# Patient Record
Sex: Female | Born: 2000 | Race: Black or African American | Hispanic: No | Marital: Single | Smoking: Never smoker
Health system: Southern US, Community
[De-identification: ages and names within clinical notes are randomized; demographics above are authoritative.]

## PROBLEM LIST (undated history)

## (undated) HISTORY — PX: ARTHROSCOPIC REPAIR ACL: SUR80

---
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
## 2019-01-01 ENCOUNTER — Ambulatory Visit (INDEPENDENT_AMBULATORY_CARE_PROVIDER_SITE_OTHER): Payer: 59 | Admitting: Physical Therapy

## 2019-01-01 ENCOUNTER — Encounter: Payer: Self-pay | Admitting: Physical Therapy

## 2019-01-01 ENCOUNTER — Other Ambulatory Visit: Payer: Self-pay

## 2019-01-01 DIAGNOSIS — M6281 Muscle weakness (generalized): Secondary | ICD-10-CM | POA: Diagnosis not present

## 2019-01-01 DIAGNOSIS — R2689 Other abnormalities of gait and mobility: Secondary | ICD-10-CM | POA: Diagnosis not present

## 2019-01-01 DIAGNOSIS — M25561 Pain in right knee: Secondary | ICD-10-CM | POA: Diagnosis not present

## 2019-01-01 NOTE — Therapy (Signed)
[place], [place], [address] Phone: [phone]   Fax:  [phone]  Physical Therapy Treatment  Patient Details  Name: Ashley Fischer MRN: [id number] Date of Birth: [birth year] Referring Provider (PT): Dr Alphonzo Grieve    Encounter Date: 01/01/2019  PT End of Session - 01/01/19 0905    Visit Number  4    Number of Visits  12    Date for PT Re-Evaluation  01/15/19    PT Start Time  0901    PT Stop Time  0952    PT Time Calculation (min)  51 min       History reviewed. No pertinent past medical history.  Past Surgical History:  Procedure Laterality Date  . ARTHROSCOPIC REPAIR ACL      There were no vitals filed for this visit.  Subjective Assessment - 01/01/19 0906    Subjective  Pt reports she worked out on eliptical 2x for 10 min since last visit.  She still has difficulty with single leg mini squats. She believes her knee feels less puffy.     Patient Stated Goals  return to play basketball     Currently in Pain?  No/denies         [facility] PT Assessment - 01/01/19 0001      Assessment   Medical Diagnosis  Rt knee scope     Referring Provider (PT)  Dr Alphonzo Grieve     Onset Date/Surgical Date  11/09/18   surgery - injury ~ 10/26/2018   Hand Dominance  Right    Next MD Visit  to be scheduled.       Strength   Right Hip Flexion  5/5    Right Hip Extension  5/5    Right Hip ABduction  --   5-/5   Right Hip ADduction  5/5    Right/Left Knee  Right    Right Knee Flexion  --   5-/5   Right Knee Extension  5/5       [facility] Adult PT Treatment/Exercise - 01/01/19 0001      Knee/Hip Exercises: Stretches   Passive Hamstring Stretch  Right;Left;2 reps;30 seconds    Quad Stretch  Right;Left;2 reps;30 seconds    Gastroc Stretch  Both;2 reps;30 seconds      Knee/Hip Exercises: Aerobic   Elliptical  L3: 6 min       Knee/Hip Exercises: Machines for Technical brewer with black/ blue band x 2.5 min - with cues for sequencing       Knee/Hip Exercises: Plyometrics   Bilateral Jumping  10 reps;Box Height: 2";Box Height: 4";2 sets   VC on soft landing and alignment   Bilateral Jumping Limitations  (forward jumping, then Rt retro step up) - cues for weight shift to R )      Knee/Hip Exercises: Standing   SLS  Rt SLS on upside Bosu x 1 min with head turns (occasional UE to steady); R/L single leg deadlift to touch cone on floor x 10    Other Standing Knee Exercises  resisted walking (XTS- pink) x 10 reps lateral stepping R/L, backward x 5 reps     Other Standing Knee Exercises  light skipping x 25 ft x 6;  side shuffle at slow to slow-med speed (improved form) x 8 reps of 25 ft.       Knee/Hip Exercises: Seated   Stool Scoot - Round Trips  80 ft  on tile; 12 ft x 4 reps on carpet      Knee/Hip Exercises: Sidelying   Other Sidelying Knee/Hip Exercises  side plank x 15 sec x 2 reps each side.          PT Short Term Goals - 12/25/18 0947      PT SHORT TERM GOAL #1   Title  Initial rehab program addressing ROM and active movement per protocol 12/22/2018    Time  4    Period  Weeks    Status  Achieved      PT SHORT TERM GOAL #2   Title  ROM Rt knee 0 degrees extension to 115 degrees flexion 12/22/2018    Time  4    Period  Weeks    Status  Achieved      PT SHORT TERM GOAL #3   Title  Improve gait pattern with patient to demonstrated equal stride length and weight bearing bilat LE's 12/22/2018    Time  4    Period  Weeks    Status  On-going   improved     PT SHORT TERM GOAL #4   Title  I in initial HEP 12/22/2018    Time  4    Period  Weeks    Status  Achieved      PT SHORT TERM GOAL #5   Title  patient to demonstrated step over step ascending and descending stairs x 4 steps 12/22/2018    Time  4    Period  Weeks    Status  Achieved        PT Long Term Goals - 01/01/19 0958      PT LONG TERM GOAL #1   Title  I with  advanced HEP 01/15/2019    Time  6    Period  Weeks    Status  On-going      PT LONG TERM GOAL #2   Title  increase Rt LE strength to 5/5 01/15/2019    Time  6    Period  Weeks    Status  Partially Met      PT LONG TERM GOAL #3   Title  Patient reports ability to ambulate for community distances including stairs 01/15/2019    Time  6    Period  Weeks    Status  Achieved   per pt report     PT LONG TERM GOAL #4   Title  tolerate high level plyometrics without knee pain and good mechanics 01/15/2019    Time  6    Period  Weeks    Status  On-going      PT LONG TERM GOAL #5   Title  improve FOTO =/< 28% limitation 01/15/2019    Time  6    Period  Weeks    Status  On-going            Plan - 01/01/19 0939    Clinical Impression Statement  Pt demonstrated improved Rt hip and knee strength; some weakness in Rt hip abdct and Lt hamstring. She has partially met LTG#2 and achieved LTG #3.  She tolerated all exercises well with mild discomfort reported with light plyometrics.  Pt declined modalities; will do at home.      Rehab Potential  Excellent    PT Frequency  2x / week    PT Duration  6 weeks    PT Treatment/Interventions  Patient/family education;ADLs/Self Care Home Management;Cryotherapy;Electrical Stimulation;Iontophoresis 61m/ml Dexamethasone;Moist Heat;Ultrasound;Dry needling;Manual  techniques;Neuromuscular re-education;Balance training;Gait training;Therapeutic activities;Therapeutic exercise    PT Next Visit Plan  continue progressive stretching/strengthening working into sports specific exercises.  modalities as indicated.  begin jog progression. continue agility and plyometrics.    Consulted and Agree with Plan of Care  Patient       Patient will benefit from skilled therapeutic intervention in order to improve the following deficits and impairments:  Abnormal gait, Decreased activity tolerance, Decreased endurance, Decreased range of motion, Decreased mobility,  Decreased strength, Pain, Increased fascial restricitons  Visit Diagnosis: Acute pain of right knee  Muscle weakness (generalized)  Other abnormalities of gait and mobility     Problem List Patient Active Problem List   Diagnosis Date Noted  . Left anterior cruciate ligament tear 09/08/2015  . ALLERGIC RHINITIS, SEASONAL 01/01/2011  . EPISTAXIS, RECURRENT 01/01/2011   Kerin Perna, PTA 01/01/19 10:10 AM   [place], [place], [address] Phone: [phone]   Fax:  [phone]  Name: Ashley Fischer MRN: [id number] Date of Birth: 2000/11/21

## 2019-01-04 ENCOUNTER — Encounter: Payer: Self-pay | Admitting: Physical Therapy

## 2019-01-04 ENCOUNTER — Ambulatory Visit (INDEPENDENT_AMBULATORY_CARE_PROVIDER_SITE_OTHER): Payer: 59 | Admitting: Physical Therapy

## 2019-01-04 ENCOUNTER — Other Ambulatory Visit: Payer: Self-pay

## 2019-01-04 DIAGNOSIS — M25561 Pain in right knee: Secondary | ICD-10-CM

## 2019-01-04 DIAGNOSIS — M6281 Muscle weakness (generalized): Secondary | ICD-10-CM

## 2019-01-04 DIAGNOSIS — R2689 Other abnormalities of gait and mobility: Secondary | ICD-10-CM

## 2019-01-04 NOTE — Therapy (Signed)
[place], [place], [address] Phone: [phone]   Fax:  [phone]  Physical Therapy Treatment  Patient Details  Name: Ashley Fischer MRN: [id number] Date of Birth: December 12, 2000 Referring Provider (PT): Dr Alphonzo Grieve    Encounter Date: 01/04/2019  PT End of Session - 01/04/19 0903    Visit Number  5    Number of Visits  12    Date for PT Re-Evaluation  01/15/19    PT Start Time  0903    PT Stop Time  0953    PT Time Calculation (min)  50 min    Activity Tolerance  Patient tolerated treatment well    Behavior During Therapy  [facility] for tasks assessed/performed       History reviewed. No pertinent past medical history.  Past Surgical History:  Procedure Laterality Date  . ARTHROSCOPIC REPAIR ACL      There were no vitals filed for this visit.  Subjective Assessment - 01/04/19 0904    Subjective  Pt reports her single leg exercises are getting some easier, leg is not as shaky.  she reports she continues to have pain in Rt knee with knee flexion in WB up to 5/10 (present since surgery).     Patient Stated Goals  return to play basketball     Currently in Pain?  No/denies         [facility] PT Assessment - 01/04/19 0001      Assessment   Medical Diagnosis  Rt knee scope     Referring Provider (PT)  Dr Alphonzo Grieve     Onset Date/Surgical Date  11/09/18   surgery - injury ~ 10/26/2018   Hand Dominance  Right    Next MD Visit  to be scheduled.        [facility] PT Treatment/Exercise - 01/04/19 0001      Knee/Hip Exercises: Stretches   Passive Hamstring Stretch  Both;1 rep;30 seconds   standing, forward bend   Quad Stretch  Left;Right;2 reps;30 seconds   standing   Gastroc Stretch  Both;2 reps;30 seconds      Knee/Hip Exercises: Aerobic   Elliptical  L3.5: 5 min     Tread Mill  3.5 mph walk x 1 mi, light jog @ 5.0 mph x 30 sec, walk @ 4.0 mph x 30 sec, jog @ 5.5 mph x  30 sec x 2 reps - total of 4 min       Knee/Hip Exercises: Standing   Lunge Walking - Round Trips  90 ft, limited depth (knee not touching floor    SLS  Rt/Lt SLS on upside Bosu with 5 reps of overhead press with 6#, then ball toss.  R/L single leg deadlift to touch cone on floor x 10; single leg hops up 4" step x 15, 6" step x 14 with UE support and cues for technique    Other Standing Knee Exercises  ladder agility drills: foward fast feet, scissors moving laterally, ski skip, lateral hops- all 4 reps of each. simulated shooting baskets (hopping 2 feet) x 30 ft x 4 reps; simulated lay ups (R/L) x 25 ft x 4 reps; skipping with emphasis on height of jump and soft landing x 25 ft x 8 reps     Other Standing Knee Exercises  side shuffle at med-fast speed x 30 ft x 6;  squat with wood chop holding 6# ball while standing on upside down Bosu  PT Short Term Goals - 12/25/18 0947      PT SHORT TERM GOAL #1   Title  Initial rehab program addressing ROM and active movement per protocol 12/22/2018    Time  4    Period  Weeks    Status  Achieved      PT SHORT TERM GOAL #2   Title  ROM Rt knee 0 degrees extension to 115 degrees flexion 12/22/2018    Time  4    Period  Weeks    Status  Achieved      PT SHORT TERM GOAL #3   Title  Improve gait pattern with patient to demonstrated equal stride length and weight bearing bilat LE's 12/22/2018    Time  4    Period  Weeks    Status  On-going   improved     PT SHORT TERM GOAL #4   Title  I in initial HEP 12/22/2018    Time  4    Period  Weeks    Status  Achieved      PT SHORT TERM GOAL #5   Title  patient to demonstrated step over step ascending and descending stairs x 4 steps 12/22/2018    Time  4    Period  Weeks    Status  Achieved        PT Long Term Goals - 01/01/19 0958      PT LONG TERM GOAL #1   Title  I with advanced HEP 01/15/2019    Time  6    Period  Weeks    Status  On-going      PT LONG TERM GOAL #2   Title   increase Rt LE strength to 5/5 01/15/2019    Time  6    Period  Weeks    Status  Partially Met      PT LONG TERM GOAL #3   Title  Patient reports ability to ambulate for community distances including stairs 01/15/2019    Time  6    Period  Weeks    Status  Achieved   per pt report     PT LONG TERM GOAL #4   Title  tolerate high level plyometrics without knee pain and good mechanics 01/15/2019    Time  6    Period  Weeks    Status  On-going      PT LONG TERM GOAL #5   Title  improve FOTO =/< 28% limitation 01/15/2019    Time  6    Period  Weeks    Status  On-going            Plan - 01/04/19 0953    Clinical Impression Statement  Pt tolerated all exercises with min increase in pain; reduced with standing rest breaks.  Noticable difference in take off / landing of single leg jumps; demonstrated decreased jump height/power on RLE.    Pt tolerated light walk/jog progression without noticable gait deviation or pain.  Pt is progressing well towards remaining goals.     Rehab Potential  Excellent    PT Frequency  2x / week    PT Duration  6 weeks    PT Treatment/Interventions  Patient/family education;ADLs/Self Care Home Management;Cryotherapy;Electrical Stimulation;Iontophoresis 46m/ml Dexamethasone;Moist Heat;Ultrasound;Dry needling;Manual techniques;Neuromuscular re-education;Balance training;Gait training;Therapeutic activities;Therapeutic exercise    PT Next Visit Plan  continue progressive stretching/strengthening working into sports specific exercises.  modalities as indicated. continue jog progression. continue agility and plyometrics.    Consulted  and Agree with Plan of Care  Patient       Patient will benefit from skilled therapeutic intervention in order to improve the following deficits and impairments:  Abnormal gait, Decreased activity tolerance, Decreased endurance, Decreased range of motion, Decreased mobility, Decreased strength, Pain, Increased fascial  restricitons  Visit Diagnosis: Acute pain of right knee  Muscle weakness (generalized)  Other abnormalities of gait and mobility     Problem List Patient Active Problem List   Diagnosis Date Noted  . Left anterior cruciate ligament tear 09/08/2015  . ALLERGIC RHINITIS, SEASONAL 01/01/2011  . EPISTAXIS, RECURRENT 01/01/2011   Kerin Perna, PTA 01/04/19 10:02 AM  [place], [place], [address] Phone: [phone]   Fax:  [phone]  Name: Ashley Fischer MRN: [id number] Date of Birth: 02-Dec-2000

## 2019-01-08 ENCOUNTER — Other Ambulatory Visit: Payer: Self-pay

## 2019-01-08 ENCOUNTER — Encounter: Payer: Self-pay | Admitting: Physical Therapy

## 2019-01-08 ENCOUNTER — Ambulatory Visit (INDEPENDENT_AMBULATORY_CARE_PROVIDER_SITE_OTHER): Payer: 59 | Admitting: Physical Therapy

## 2019-01-08 DIAGNOSIS — M6281 Muscle weakness (generalized): Secondary | ICD-10-CM

## 2019-01-08 DIAGNOSIS — M25561 Pain in right knee: Secondary | ICD-10-CM

## 2019-01-08 DIAGNOSIS — R2689 Other abnormalities of gait and mobility: Secondary | ICD-10-CM

## 2019-01-08 NOTE — Therapy (Signed)
[place], [place], [address] Phone: [phone]   Fax:  [phone]  Physical Therapy Treatment  Patient Details  Name: Ashley Fischer MRN: [id number] Date of Birth: Apr 20, 2001 Referring Provider (PT): Dr Alphonzo Grieve    Encounter Date: 01/08/2019  PT End of Session - 01/08/19 0907    Visit Number  6    Number of Visits  12    Date for PT Re-Evaluation  01/15/19    PT Start Time  0901    PT Stop Time  0950    PT Time Calculation (min)  49 min    Activity Tolerance  Patient tolerated treatment well;No increased pain    Behavior During Therapy  [facility] for tasks assessed/performed       History reviewed. No pertinent past medical history.  Past Surgical History:  Procedure Laterality Date  . ARTHROSCOPIC REPAIR ACL      There were no vitals filed for this visit.  Subjective Assessment - 01/08/19 0908    Subjective  Pt reports she was able to get up to 20 min on eliptical 2x this weekend, that was hard.  She still feels weakness in RLE with attempts to hop.     Pertinent History  Lt ACL repair 11/14/15 with extensive rehab following surgery    Patient Stated Goals  return to play basketball     Currently in Pain?  No/denies    Pain Score  0-No pain   just "stiffness"         [facility] PT Assessment - 01/08/19 0001      Assessment   Medical Diagnosis  Rt knee scope     Referring Provider (PT)  Dr Alphonzo Grieve     Onset Date/Surgical Date  11/09/18   surgery - injury ~ 10/26/2018   Hand Dominance  Right    Next MD Visit  to be scheduled.        [facility] Adult PT Treatment/Exercise - 01/08/19 0001      Knee/Hip Exercises: Stretches   Passive Hamstring Stretch  1 rep;30 seconds;Left;Right   standing, forward bend   Sports administrator  Left;Right;2 reps;30 seconds   standing   Gastroc Stretch  Both;2 reps;30 seconds      Knee/Hip Exercises: Aerobic   Elliptical  L3.5: 5 min     Tread Mill  3.5 mph walk x 1 mi, light jog @ 5.0 mph x 30 sec, walk @ 4.0 mph x 30 sec, jog @ 5.5 mph x 30 sec x 2 reps - total of 4 min       Knee/Hip Exercises: Machines for Strengthening   Total Gym Leg Press  RLE only, 6 plates x 10, 7 plates x 10 reps       Knee/Hip Exercises: Plyometrics   Bilateral Jumping  10 reps;2 sets;Box Height: 6";Box Height: 8"   VC on soft landing and alignment   Bilateral Jumping Limitations  (forward jumping, then Rt retro step up) - cues for weight shift to R )      Knee/Hip Exercises: Standing   Heel Raises  Right;Left;1 set;10 reps   with bilat shoulder ext with green band   Side Lunges  2 sets;Right;Left;10 reps   with 10# to touch ground   Side Lunges Limitations  challenging on RLE    Other Standing Knee Exercises  ladder agility drills: scissors moving laterally, ski skip x  5 reps of each. simulated shooting baskets (hopping 2 feet)  x 30 ft x 4 reps; simulated lay ups (R/L) x 25 ft x 4 reps;   circuit with light forward jogging, R/L side shuffle x 25 ft x 5 reps     Other Standing Knee Exercises  side shuffle at med-fast speed x 30 ft x 6;  squat with wood chop holding 11# ball while standing on upside down Bosu               PT Short Term Goals - 12/25/18 0947      PT SHORT TERM GOAL #1   Title  Initial rehab program addressing ROM and active movement per protocol 12/22/2018    Time  4    Period  Weeks    Status  Achieved      PT SHORT TERM GOAL #2   Title  ROM Rt knee 0 degrees extension to 115 degrees flexion 12/22/2018    Time  4    Period  Weeks    Status  Achieved      PT SHORT TERM GOAL #3   Title  Improve gait pattern with patient to demonstrated equal stride length and weight bearing bilat LE's 12/22/2018    Time  4    Period  Weeks    Status  On-going   improved     PT SHORT TERM GOAL #4   Title  I in initial HEP 12/22/2018    Time  4    Period  Weeks    Status  Achieved      PT SHORT TERM GOAL #5   Title  patient  to demonstrated step over step ascending and descending stairs x 4 steps 12/22/2018    Time  4    Period  Weeks    Status  Achieved        PT Long Term Goals - 01/01/19 0958      PT LONG TERM GOAL #1   Title  I with advanced HEP 01/15/2019    Time  6    Period  Weeks    Status  On-going      PT LONG TERM GOAL #2   Title  increase Rt LE strength to 5/5 01/15/2019    Time  6    Period  Weeks    Status  Partially Met      PT LONG TERM GOAL #3   Title  Patient reports ability to ambulate for community distances including stairs 01/15/2019    Time  6    Period  Weeks    Status  Achieved   per pt report     PT LONG TERM GOAL #4   Title  tolerate high level plyometrics without knee pain and good mechanics 01/15/2019    Time  6    Period  Weeks    Status  On-going      PT LONG TERM GOAL #5   Title  improve FOTO =/< 28% limitation 01/15/2019    Time  6    Period  Weeks    Status  On-going            Plan - 01/08/19 0951    Clinical Impression Statement  Pt tolerated all exercises well without any report of pain. She was able to demonstrate improved ability to maintain more even weight between feet from from 6-8" step (plyometric jumps) She continues to demonstrate functional weakness in RLE, with decreased jump height and hard landing with single leg hop and increased difficulty with  Rt single leg press. Pt is making gradual progress towards remaining goals.     Rehab Potential  Excellent    PT Frequency  2x / week    PT Duration  6 weeks    PT Treatment/Interventions  Patient/family education;ADLs/Self Care Home Management;Cryotherapy;Electrical Stimulation;Iontophoresis 58m/ml Dexamethasone;Moist Heat;Ultrasound;Dry needling;Manual techniques;Neuromuscular re-education;Balance training;Gait training;Therapeutic activities;Therapeutic exercise    PT Next Visit Plan  continue progressive stretching/strengthening working into sports specific exercises.  modalities as indicated.  continue jog progression. continue agility and plyometrics.    Consulted and Agree with Plan of Care  Patient       Patient will benefit from skilled therapeutic intervention in order to improve the following deficits and impairments:  Abnormal gait, Decreased activity tolerance, Decreased endurance, Decreased range of motion, Decreased mobility, Decreased strength, Pain, Increased fascial restricitons  Visit Diagnosis: Acute pain of right knee  Muscle weakness (generalized)  Other abnormalities of gait and mobility     Problem List Patient Active Problem List   Diagnosis Date Noted  . Left anterior cruciate ligament tear 09/08/2015  . ALLERGIC RHINITIS, SEASONAL 01/01/2011  . EPISTAXIS, RECURRENT 01/01/2011   JKerin Perna PTA 01/08/19 10:13 AM  [place] [address]: [phone]  Fax:  [phone] Name: Ashley FRANZONIMRN: [id number] of Birth: 1December 13, 2002

## 2019-01-11 ENCOUNTER — Encounter: Payer: Self-pay | Admitting: Physical Therapy

## 2019-01-11 ENCOUNTER — Ambulatory Visit (INDEPENDENT_AMBULATORY_CARE_PROVIDER_SITE_OTHER): Payer: 59 | Admitting: Physical Therapy

## 2019-01-11 ENCOUNTER — Other Ambulatory Visit: Payer: Self-pay

## 2019-01-11 DIAGNOSIS — M6281 Muscle weakness (generalized): Secondary | ICD-10-CM

## 2019-01-11 DIAGNOSIS — M25561 Pain in right knee: Secondary | ICD-10-CM

## 2019-01-11 DIAGNOSIS — R2689 Other abnormalities of gait and mobility: Secondary | ICD-10-CM

## 2019-01-11 NOTE — Therapy (Signed)
[place], [place], [address] Phone: [phone]   Fax:  [phone]  Physical Therapy Treatment  Patient Details  Name: Ashley Fischer MRN: [id number] Date of Birth: 07-Aug-2001 Referring Provider (PT): Dr Alphonzo Grieve    Encounter Date: 01/11/2019  PT End of Session - 01/11/19 0910    Visit Number  7    Number of Visits  12    Date for PT Re-Evaluation  01/15/19    PT Start Time  0900    PT Stop Time  0959    PT Time Calculation (min)  59 min    Activity Tolerance  Patient tolerated treatment well    Behavior During Therapy  [facility] for tasks assessed/performed       History reviewed. No pertinent past medical history.  Past Surgical History:  Procedure Laterality Date  . ARTHROSCOPIC REPAIR ACL      There were no vitals filed for this visit.  Subjective Assessment - 01/11/19 0910    Subjective  Pt reports her Rt knee remains stiff in morning and throughout the day if she is sitting too long.  she has some mild pain in Rt knee with HEP.     Patient Stated Goals  return to play basketball     Currently in Pain?  No/denies    Pain Score  0-No pain         [facility] PT Assessment - 01/11/19 0001      Assessment   Medical Diagnosis  Rt knee scope     Referring Provider (PT)  Dr Alphonzo Grieve     Onset Date/Surgical Date  11/09/18   surgery - injury ~ 10/26/2018   Hand Dominance  Right    Next MD Visit  to be scheduled.        [facility] Adult PT Treatment/Exercise - 01/11/19 0001      Knee/Hip Exercises: Stretches   Passive Hamstring Stretch  Both;2 reps;20 seconds    Quad Stretch  Left;Right;2 reps;30 seconds   standing   ITB Stretch  Right;Left;1 rep;20 seconds    Gastroc Stretch  Both;2 reps;30 seconds      Knee/Hip Exercises: Aerobic   Elliptical  L3.5: 3 min       Knee/Hip Exercises: Machines for Strengthening   Total Gym Leg Press  RLE x 10 with 7 plates x 2 sets      Knee/Hip Exercises: Plyometrics   Bilateral Jumping  2 sets   forward) 25-30 ft x 4 reps    Unilateral Jumping  2 sets   (forward) 25-30 ft x 4 reps   Other Plyometric Exercises  double leg jump hops x 25 ft x 4 (VC for equal landing); double leg jumps x 20 on mini tramp (mirror for visual feedback) VC to shft more weight to Rt; mini tuck jumps on mini tramp x 10;  alternating hop and stick x 25 ft x 4; power skipping x 25 ft x 4    Other Plyometric Exercises  wall jumps x 20 (10 slow, 10 fast), 2 sets;  single leg hop with light support on counter x 10, then forward/backward pattern x 20 - with focus on soft landing       Knee/Hip Exercises: Sidelying   Other Sidelying Knee/Hip Exercises  attempt at side crunches while on swiss ball; unable to complete due to difficulty maintaining starting position.       Knee/Hip Exercises: Prone   Other Prone Exercises  reverse hyperextension x 10 reps; superman on bosu x 10        PT Short Term Goals - 12/25/18 0947      PT SHORT TERM GOAL #1   Title  Initial rehab program addressing ROM and active movement per protocol 12/22/2018    Time  4    Period  Weeks    Status  Achieved      PT SHORT TERM GOAL #2   Title  ROM Rt knee 0 degrees extension to 115 degrees flexion 12/22/2018    Time  4    Period  Weeks    Status  Achieved      PT SHORT TERM GOAL #3   Title  Improve gait pattern with patient to demonstrated equal stride length and weight bearing bilat LE's 12/22/2018    Time  4    Period  Weeks    Status  On-going   improved     PT SHORT TERM GOAL #4   Title  I in initial HEP 12/22/2018    Time  4    Period  Weeks    Status  Achieved      PT SHORT TERM GOAL #5   Title  patient to demonstrated step over step ascending and descending stairs x 4 steps 12/22/2018    Time  4    Period  Weeks    Status  Achieved        PT Long Term Goals - 01/01/19 0958      PT LONG TERM GOAL #1   Title  I with advanced HEP 01/15/2019    Time  6     Period  Weeks    Status  On-going      PT LONG TERM GOAL #2   Title  increase Rt LE strength to 5/5 01/15/2019    Time  6    Period  Weeks    Status  Partially Met      PT LONG TERM GOAL #3   Title  Patient reports ability to ambulate for community distances including stairs 01/15/2019    Time  6    Period  Weeks    Status  Achieved   per pt report     PT LONG TERM GOAL #4   Title  tolerate high level plyometrics without knee pain and good mechanics 01/15/2019    Time  6    Period  Weeks    Status  On-going      PT LONG TERM GOAL #5   Title  improve FOTO =/< 28% limitation 01/15/2019    Time  6    Period  Weeks    Status  On-going            Plan - 01/11/19 1135    Clinical Impression Statement  Continued functional weakness demonstrated in RLE, with single leg exercises and plyometrics.  Pt tolerated all exercises well, with minimal increase in pain in knee.  She conitnues to have difficulty controlling landing on RLE and has minimal lift off with single leg jumps.   Adjusted HEP to include light plyometrics to assist with return to sport.  Gradually progressing towards remaining goals.     Rehab Potential  Excellent    PT Frequency  2x / week    PT Duration  6 weeks    PT Treatment/Interventions  Patient/family education;ADLs/Self Care Home Management;Cryotherapy;Electrical Stimulation;Iontophoresis 63m/ml Dexamethasone;Moist Heat;Ultrasound;Dry needling;Manual techniques;Neuromuscular re-education;Balance training;Gait training;Therapeutic activities;Therapeutic exercise    PT  Next Visit Plan  assess goals and need for additional visits; end of POC. assess response to new HEP.     Consulted and Agree with Plan of Care  Patient       Patient will benefit from skilled therapeutic intervention in order to improve the following deficits and impairments:  Abnormal gait, Decreased activity tolerance, Decreased endurance, Decreased range of motion, Decreased mobility,  Decreased strength, Pain, Increased fascial restricitons  Visit Diagnosis: Acute pain of right knee  Muscle weakness (generalized)  Other abnormalities of gait and mobility     Problem List Patient Active Problem List   Diagnosis Date Noted  . Left anterior cruciate ligament tear 09/08/2015  . ALLERGIC RHINITIS, SEASONAL 01/01/2011  . EPISTAXIS, RECURRENT 01/01/2011   Kerin Perna, PTA 01/11/19 11:50 AM  [facility] [place], [place], [address] Phone: [phone]   Fax:  [phone]  Name: KYNLEA BLACKSTON MRN: [id number] Date of Birth: [birth year]

## 2019-01-11 NOTE — Patient Instructions (Signed)
Wall jumps Single leg jumps at counter (soft landing) supermans Reverse hyperextension Side planks Single leg RDL Walking lunges Single leg bridge Single leg squat   3 way resisted leg lift while standing on foam  30 sec jog, 1 min walk run progression - 10 min total to start.

## 2019-01-15 ENCOUNTER — Ambulatory Visit (INDEPENDENT_AMBULATORY_CARE_PROVIDER_SITE_OTHER): Payer: 59 | Admitting: Physical Therapy

## 2019-01-15 ENCOUNTER — Encounter: Payer: Self-pay | Admitting: Physical Therapy

## 2019-01-15 ENCOUNTER — Other Ambulatory Visit: Payer: Self-pay

## 2019-01-15 DIAGNOSIS — M6281 Muscle weakness (generalized): Secondary | ICD-10-CM | POA: Diagnosis not present

## 2019-01-15 DIAGNOSIS — M25561 Pain in right knee: Secondary | ICD-10-CM | POA: Diagnosis not present

## 2019-01-15 DIAGNOSIS — R2689 Other abnormalities of gait and mobility: Secondary | ICD-10-CM | POA: Diagnosis not present

## 2019-01-15 NOTE — Therapy (Signed)
[place], [place], [address] Phone: [phone]   Fax:  [phone]  Physical Therapy Treatment  Patient Details  Name: Ashley Fischer MRN: [id number] Date of Birth: Dec 27, 2000 Referring Provider (PT): Dr Alphonzo Grieve    Encounter Date: 01/15/2019  PT End of Session - 01/15/19 0906    Visit Number  8    Number of Visits  22    Date for PT Re-Evaluation  02/26/19    PT Start Time  0902    PT Stop Time  0955    PT Time Calculation (min)  53 min    Activity Tolerance  Patient tolerated treatment well    Behavior During Therapy  [facility] for tasks assessed/performed       History reviewed. No pertinent past medical history.  Past Surgical History:  Procedure Laterality Date  . ARTHROSCOPIC REPAIR ACL      There were no vitals filed for this visit.  Subjective Assessment - 01/15/19 0907    Subjective  Pt reports she had some pain and buckling in Rt knee when she was on her eliptical.  Pain was up to 5/10; she iced it to reduce pain.      Patient Stated Goals  return to play basketball     Currently in Pain?  No/denies    Pain Score  0-No pain         [facility] PT Assessment - 01/15/19 0001      Assessment   Medical Diagnosis  Rt knee scope     Referring Provider (PT)  Dr Alphonzo Grieve     Onset Date/Surgical Date  11/09/18   surgery - injury ~ 10/26/2018   Hand Dominance  Right    Next MD Visit  to be scheduled.       Observation/Other Assessments   Focus on Therapeutic Outcomes (FOTO)   36% limitation (28% goal)      Strength   Right/Left Hip  Right    Right Hip Flexion  5/5    Right Hip Extension  5/5    Right Hip ABduction  5/5    Right Hip ADduction  5/5    Right/Left Knee  Right;Left    Right Knee Flexion  5/5    Right Knee Extension  4+/5    Left Knee Flexion  4/5    Left Knee Extension  5/5       [facility] Adult PT Treatment/Exercise - 01/15/19 0001       Knee/Hip Exercises: Stretches   Passive Hamstring Stretch  Both;2 reps;20 seconds   2 sets   Quad Stretch  Left;Right;2 reps;30 seconds   standing, 2 sets   ITB Stretch  Right;Left;20 seconds;2 reps   2 sets   Gastroc Stretch  Both;2 reps;30 seconds      Knee/Hip Exercises: Aerobic   Elliptical  L3: 1.5 min for warm up.     Tread Mill  forward walking on 15% decline @ up to 3.0 mph x 3 min (cues for even weight/ cadence);   walk/jog progression for 4 min total,  30-60 sec walk/jog intervals - jog up to 5.0 mph.       Knee/Hip Exercises: Plyometrics   Other Plyometric Exercises  single leg jump on mini- tramp x 3 sets of 5 jumps (challening on RLE); double leg jump on mini tramp x 30, mirror for feedback      Knee/Hip Exercises: Standing   Heel Raises  Right;Left;1 set;20  reps    Heel Raises Limitations  Rt heel height 3.5", Lt heel height 4.75".  1 set of bilat heel raises, with weight shift to RLE and eccentric lowering on RLE only.     Other Standing Knee Exercises  power skipping x 30 ft x 6 reps (decreased height of RLE observed);  side shuffle 30 ft x 6 reps (medium speed);   back pedaling 20 ft, forward jog  20 ft x 6 reps;  back pedal switching to side shuffle 25 ft x 6 reps with increasing speed      Knee/Hip Exercises: Sidelying   Other Sidelying Knee/Hip Exercises  side plank x 15 sec, followed by 10 pulsed to mat - each side      Knee/Hip Exercises: Prone   Other Prone Exercises  plank with hip ext, 2 sets lifting LLE, 1 set lifting RLE.       Modalities   Modalities  --   pt declined, will ice at home.      Pt shown single leg curls with bridge for LLE for HEP; pt verbalized understanding.    PT Short Term Goals - 01/15/19 1042      PT SHORT TERM GOAL #1   Title  Initial rehab program addressing ROM and active movement per protocol 12/22/2018    Time  4    Period  Weeks    Status  Achieved      PT SHORT TERM GOAL #2   Title  ROM Rt knee 0 degrees extension to 115  degrees flexion 12/22/2018    Time  4    Period  Weeks    Status  Achieved      PT SHORT TERM GOAL #3   Title  Improve gait pattern with patient to demonstrated equal stride length and weight bearing bilat LE's 12/22/2018    Baseline  improved with VC and repetition during therapy session    Time  4    Period  Weeks    Status  Partially Met      PT SHORT TERM GOAL #4   Title  I in initial HEP 12/22/2018    Time  4    Period  Weeks    Status  Achieved      PT SHORT TERM GOAL #5   Title  patient to demonstrated step over step ascending and descending stairs x 4 steps 12/22/2018    Time  4    Period  Weeks    Status  Achieved        PT Long Term Goals - 01/15/19 0913      PT LONG TERM GOAL #1   Title  I with advanced HEP 02/26/2019    Time  12    Period  Weeks    Status  Revised      PT LONG TERM GOAL #2   Title  increase Rt LE strength to 5/5 02/26/2019    Time  12    Period  Weeks    Status  Revised      PT LONG TERM GOAL #3   Title  Patient reports ability to ambulate for community distances including stairs 01/15/2019    Time  6    Period  Weeks    Status  Achieved      PT LONG TERM GOAL #4   Title  tolerate high level plyometrics without knee pain and good mechanics 02/26/2019    Baseline  pt reports some increase in pain  in Rt knee; continues to have increased WB into L vs equal.     Time  12    Period  Weeks    Status  Revised      PT LONG TERM GOAL #5   Title  improve FOTO =/< 28% limitation 02/26/2019    Time  12    Period  Weeks    Status  Revised            Plan - 01/15/19 0959    Clinical Impression Statement  Pt has been making gradual gains each visit.  She tolerated all exercises well, without pain, except side shuffling, which increased Rt knee pain to 5/10; resolved with rest.  Pt demonstrated decreased strength in Lt hamstring and Rt quad with MMT, and decreased height of heel raise on RLE by 1.25" compared to LLE with single heel raises.  She  continues to have difficulty with plyometrics/ agility drills that include single leg jumps and change in direction.  Her FOTO score has improved.  She has partially met her goals and will benefit from continued PT intervention to maximize functional mobility and return to sport.     Rehab Potential  Excellent    PT Frequency  2x / week    PT Duration  6 weeks    PT Treatment/Interventions  Patient/family education;ADLs/Self Care Home Management;Cryotherapy;Electrical Stimulation;Iontophoresis 67m/ml Dexamethasone;Moist Heat;Ultrasound;Dry needling;Manual techniques;Neuromuscular re-education;Balance training;Gait training;Therapeutic activities;Therapeutic exercise    PT Next Visit Plan  spoke to supervising PT; will request additional visits.     Consulted and Agree with Plan of Care  Patient       Patient will benefit from skilled therapeutic intervention in order to improve the following deficits and impairments:  Abnormal gait, Decreased activity tolerance, Decreased endurance, Decreased range of motion, Decreased mobility, Decreased strength, Pain, Increased fascial restricitons  Visit Diagnosis: Acute pain of right knee - Plan: PT plan of care cert/re-cert  Muscle weakness (generalized) - Plan: PT plan of care cert/re-cert  Other abnormalities of gait and mobility - Plan: PT plan of care cert/re-cert     Problem List Patient Active Problem List   Diagnosis Date Noted  . Left anterior cruciate ligament tear 09/08/2015  . ALLERGIC RHINITIS, SEASONAL 01/01/2011  . EPISTAXIS, RECURRENT 01/01/2011   JKerin Perna PTA 01/15/19 11:48 AM  Celyn P. HHelene KelpPT, MPH 01/15/19 11:48 AM   [facility] [place] [address]Phone: [phone]  Fax:  [phone] Name: Ashley DOOLANMRN: [id number] of Birth: [birth year]

## 2019-01-18 ENCOUNTER — Ambulatory Visit (INDEPENDENT_AMBULATORY_CARE_PROVIDER_SITE_OTHER): Payer: 59 | Admitting: Physical Therapy

## 2019-01-18 ENCOUNTER — Other Ambulatory Visit: Payer: Self-pay

## 2019-01-18 DIAGNOSIS — M6281 Muscle weakness (generalized): Secondary | ICD-10-CM | POA: Diagnosis not present

## 2019-01-18 DIAGNOSIS — R2689 Other abnormalities of gait and mobility: Secondary | ICD-10-CM | POA: Diagnosis not present

## 2019-01-18 DIAGNOSIS — M25561 Pain in right knee: Secondary | ICD-10-CM | POA: Diagnosis not present

## 2019-01-18 NOTE — Therapy (Signed)
[place], [place], [address] Phone: [phone]   Fax:  [phone]  Physical Therapy Treatment  Patient Details  Name: Ashley Fischer MRN: [id number] Date of Birth: 2000/09/29 Referring Provider (PT): Dr Alphonzo Grieve    Encounter Date: 01/18/2019  PT End of Session - 01/18/19 1009    Visit Number  9    Number of Visits  22    Date for PT Re-Evaluation  02/26/19    PT Start Time  0905    PT Stop Time  0950    PT Time Calculation (min)  45 min    Activity Tolerance  Patient tolerated treatment well    Behavior During Therapy  [facility] for tasks assessed/performed       No past medical history on file.  Past Surgical History:  Procedure Laterality Date  . ARTHROSCOPIC REPAIR ACL      There were no vitals filed for this visit.  Subjective Assessment - 01/18/19 0906    Subjective  "I walked like 5-6 miles in [place]. Afterward my knee was sore and my incisions were bulky".  Pt reports soreness resolved with rest; she has not used ice since 01/15/19.     Patient Stated Goals  return to play basketball     Currently in Pain?  No/denies    Pain Score  0-No pain    Pain Location  Knee    Pain Orientation  Right         [facility] PT Assessment - 01/18/19 0001      Assessment   Medical Diagnosis  Rt knee scope     Referring Provider (PT)  Dr Alphonzo Grieve     Onset Date/Surgical Date  11/09/18   surgery - injury ~ 10/26/2018   Hand Dominance  Right    Next MD Visit  to be scheduled.        [facility] Adult PT Treatment/Exercise - 01/18/19 0001      Knee/Hip Exercises: Stretches   Passive Hamstring Stretch  Both;2 reps;20 seconds   2 sets   Sports administrator  Left;Right;2 reps;30 seconds   standing, 2 sets   ITB Stretch  Right;Left;20 seconds;2 reps   2 sets   Gastroc Stretch  Both;2 reps;30 seconds      Knee/Hip Exercises: Aerobic   Tread Mill  walk/ jog for 3.5 min (up to 5.0 mph  jog), 30 sec walk, 1 min jog progression     Recumbent Bike  L5: 3 min       Knee/Hip Exercises: Machines for Strengthening   Total Gym Leg Press  RLE x 10 with 7 plates, x 10 with 8 plates       Knee/Hip Exercises: Plyometrics   Bilateral Jumping  1 set;20 reps;Box Height: 4";10 reps;Box Height: 6";Box Height: 8"   forward jumps onto steps     Knee/Hip Exercises: Standing   Heel Raises Limitations   1 set of bilat heel raises with heels off edge of stair, with weight shift to RLE and eccentric lowering on RLE only x 20.     SLS  Rt/Lt single RDL with hand touching black mat x 10 each leg    Rebounder  rocking horse pattern of bouncing (Lt / Rt foot forward in a staggered stance) x 10 each side;  in-out jumping jack motion x 20.     Other Standing Knee Exercises  reverse lunge x 5 each leg, repeated with explosive push up to  upright x 10 RLE, 5 LLE.     Other Standing Knee Exercises  side shuffle 25 ft x 6 reps;  back pedal and light forward jog x 25 ft x 6; back pedal shifting to side shuffle x 30 ft x 6 reps; simulated lay up jumping on RLE x 5 reps (limited liftoff)               PT Short Term Goals - 01/15/19 1042      PT SHORT TERM GOAL #1   Title  Initial rehab program addressing ROM and active movement per protocol 12/22/2018    Time  4    Period  Weeks    Status  Achieved      PT SHORT TERM GOAL #2   Title  ROM Rt knee 0 degrees extension to 115 degrees flexion 12/22/2018    Time  4    Period  Weeks    Status  Achieved      PT SHORT TERM GOAL #3   Title  Improve gait pattern with patient to demonstrated equal stride length and weight bearing bilat LE's 12/22/2018    Baseline  improved with VC and repetition during therapy session    Time  4    Period  Weeks    Status  Partially Met      PT SHORT TERM GOAL #4   Title  I in initial HEP 12/22/2018    Time  4    Period  Weeks    Status  Achieved      PT SHORT TERM GOAL #5   Title  patient to demonstrated step over  step ascending and descending stairs x 4 steps 12/22/2018    Time  4    Period  Weeks    Status  Achieved        PT Long Term Goals - 01/15/19 0913      PT LONG TERM GOAL #1   Title  I with advanced HEP 02/26/2019    Time  12    Period  Weeks    Status  Revised      PT LONG TERM GOAL #2   Title  increase Rt LE strength to 5/5 02/26/2019    Time  12    Period  Weeks    Status  Revised      PT LONG TERM GOAL #3   Title  Patient reports ability to ambulate for community distances including stairs 01/15/2019    Time  6    Period  Weeks    Status  Achieved      PT LONG TERM GOAL #4   Title  tolerate high level plyometrics without knee pain and good mechanics 02/26/2019    Baseline  pt reports some increase in pain in Rt knee; continues to have increased WB into L vs equal.     Time  12    Period  Weeks    Status  Revised      PT LONG TERM GOAL #5   Title  improve FOTO =/< 28% limitation 02/26/2019    Time  12    Period  Weeks    Status  Revised            Plan - 01/18/19 1111    Clinical Impression Statement  Continued improvement in RLE strength with tolerance for increased wt in single leg press.  Continued focus on equal weight bearing during take off and landing of bilat plyometric jumps.  Progressing gradually towards remaining goals.     Rehab Potential  Excellent    PT Frequency  2x / week    PT Duration  6 weeks    PT Treatment/Interventions  Patient/family education;ADLs/Self Care Home Management;Cryotherapy;Electrical Stimulation;Iontophoresis 18m/ml Dexamethasone;Moist Heat;Ultrasound;Dry needling;Manual techniques;Neuromuscular re-education;Balance training;Gait training;Therapeutic activities;Therapeutic exercise    PT Next Visit Plan  continue Rt quad, Lt hamstring strengthening; sport specific drills.     Consulted and Agree with Plan of Care  Patient       Patient will benefit from skilled therapeutic intervention in order to improve the following deficits  and impairments:  Abnormal gait, Decreased activity tolerance, Decreased endurance, Decreased range of motion, Decreased mobility, Decreased strength, Pain, Increased fascial restricitons  Visit Diagnosis: Muscle weakness (generalized)  Acute pain of right knee  Other abnormalities of gait and mobility     Problem List Patient Active Problem List   Diagnosis Date Noted  . Left anterior cruciate ligament tear 09/08/2015  . ALLERGIC RHINITIS, SEASONAL 01/01/2011  . EPISTAXIS, RECURRENT 01/01/2011   JKerin Perna PTA 01/18/19 11:20 AM  [place] [place] [address]Phone: [phone]  Fax:  [phone] Name: ANAYELY DINGUSMRN: [id number] of Birth: [birth year]

## 2019-01-22 ENCOUNTER — Encounter: Payer: Self-pay | Admitting: Physical Therapy

## 2019-01-22 ENCOUNTER — Other Ambulatory Visit: Payer: Self-pay

## 2019-01-22 ENCOUNTER — Ambulatory Visit (INDEPENDENT_AMBULATORY_CARE_PROVIDER_SITE_OTHER): Payer: 59 | Admitting: Physical Therapy

## 2019-01-22 DIAGNOSIS — M6281 Muscle weakness (generalized): Secondary | ICD-10-CM | POA: Diagnosis not present

## 2019-01-22 DIAGNOSIS — M25561 Pain in right knee: Secondary | ICD-10-CM

## 2019-01-22 DIAGNOSIS — R2689 Other abnormalities of gait and mobility: Secondary | ICD-10-CM

## 2019-01-22 NOTE — Therapy (Signed)
[place], [place], [address] Phone: [phone]   Fax:  [phone]  Physical Therapy Treatment  Patient Details  Name: Ashley Fischer MRN: [id number] Date of Birth: [birth year] Referring Provider (PT): Dr Alphonzo Grieve    Encounter Date: 01/22/2019  PT End of Session - 01/22/19 1009    Visit Number  10    Number of Visits  22    Date for PT Re-Evaluation  02/26/19    PT Start Time  0903    PT Stop Time  0953    PT Time Calculation (min)  50 min    Activity Tolerance  Patient tolerated treatment well    Behavior During Therapy  [facility] for tasks assessed/performed       History reviewed. No pertinent past medical history.  Past Surgical History:  Procedure Laterality Date  . ARTHROSCOPIC REPAIR ACL      There were no vitals filed for this visit.  Subjective Assessment - 01/22/19 0906    Subjective  Pt reports she walked 3+ miles this weekend, and she had similar symptoms to last weekend. This time she iced afterward.  She thinks her jumping may be improving, as well as her overall pain level with exercise.     Patient Stated Goals  return to play basketball     Currently in Pain?  No/denies         [facility] PT Assessment - 01/22/19 0001      Assessment   Medical Diagnosis  Rt knee scope     Referring Provider (PT)  Dr Alphonzo Grieve     Onset Date/Surgical Date  11/09/18   surgery - injury ~ 10/26/2018   Hand Dominance  Right    Next MD Visit  to be scheduled.       [facility] Adult PT Treatment/Exercise - 01/22/19 0001      Knee/Hip Exercises: Stretches   Passive Hamstring Stretch  Both;2 reps;20 seconds   2 sets   Sports administrator  Left;Right;2 reps;30 seconds   standing, 2 sets   ITB Stretch  Right;Left;20 seconds;2 reps   2 sets   Gastroc Stretch  Both;2 reps;30 seconds      Knee/Hip Exercises: Aerobic   Elliptical  L4: 4 min       Knee/Hip Exercises: Machines for  Strengthening   Total Gym Leg Press  RLE x 10 with 8 plates       Knee/Hip Exercises: Plyometrics   Bilateral Jumping  1 set;20 reps;Box Height: 4";10 reps;Box Height: 6";Box Height: 8"   forward hop ascend/descend steps   Bilateral Jumping Limitations  focus on even weight and soft landing    Unilateral Jumping Limitations  single leg hop (L x 5) R x 5 reps, 2 sets on stairs, then single leg hop with opp side arm reach x 10 rep RLE;     Other Plyometric Exercises  forward step onto 10.5" step with hop x 30 sec each leg    Other Plyometric Exercises  wall jumps x 20       Knee/Hip Exercises: Standing   Forward Step Up  Right;Left   quick single let step ups, 30 sec   Other Standing Knee Exercises  side shuffle with med-quick speed x 30 ft x 6 reps;  side lunge holding small cone, touching floor, x 20 ft x 2 reps L/R.  zig zag 5 ft L/R, 5 reps, 5 sets.  PT Short Term Goals - 01/15/19 1042      PT SHORT TERM GOAL #1   Title  Initial rehab program addressing ROM and active movement per protocol 12/22/2018    Time  4    Period  Weeks    Status  Achieved      PT SHORT TERM GOAL #2   Title  ROM Rt knee 0 degrees extension to 115 degrees flexion 12/22/2018    Time  4    Period  Weeks    Status  Achieved      PT SHORT TERM GOAL #3   Title  Improve gait pattern with patient to demonstrated equal stride length and weight bearing bilat LE's 12/22/2018    Baseline  improved with VC and repetition during therapy session    Time  4    Period  Weeks    Status  Partially Met      PT SHORT TERM GOAL #4   Title  I in initial HEP 12/22/2018    Time  4    Period  Weeks    Status  Achieved      PT SHORT TERM GOAL #5   Title  patient to demonstrated step over step ascending and descending stairs x 4 steps 12/22/2018    Time  4    Period  Weeks    Status  Achieved        PT Long Term Goals - 01/15/19 0913      PT LONG TERM GOAL #1   Title  I with advanced HEP 02/26/2019     Time  12    Period  Weeks    Status  Revised      PT LONG TERM GOAL #2   Title  increase Rt LE strength to 5/5 02/26/2019    Time  12    Period  Weeks    Status  Revised      PT LONG TERM GOAL #3   Title  Patient reports ability to ambulate for community distances including stairs 01/15/2019    Time  6    Period  Weeks    Status  Achieved      PT LONG TERM GOAL #4   Title  tolerate high level plyometrics without knee pain and good mechanics 02/26/2019    Baseline  pt reports some increase in pain in Rt knee; continues to have increased WB into L vs equal.     Time  12    Period  Weeks    Status  Revised      PT LONG TERM GOAL #5   Title  improve FOTO =/< 28% limitation 02/26/2019    Time  12    Period  Weeks    Status  Revised            Plan - 01/22/19 1009    Clinical Impression Statement  Pt continues to be limited with single leg jump on RLE, with decreased height and power.  Single leg jump on 4 and 6" steps was slightly improved from last attempt.  Pt fatigued quickly with single leg press. Pt making gradual progress towards remaining goals.     Rehab Potential  Excellent    PT Frequency  2x / week    PT Duration  6 weeks    PT Treatment/Interventions  Patient/family education;ADLs/Self Care Home Management;Cryotherapy;Electrical Stimulation;Iontophoresis 35m/ml Dexamethasone;Moist Heat;Ultrasound;Dry needling;Manual techniques;Neuromuscular re-education;Balance training;Gait training;Therapeutic activities;Therapeutic exercise    PT Next Visit Plan  continue Rt quad strengthening; sport specific drills.     Consulted and Agree with Plan of Care  Patient       Patient will benefit from skilled therapeutic intervention in order to improve the following deficits and impairments:  Abnormal gait, Decreased activity tolerance, Decreased endurance, Decreased range of motion, Decreased mobility, Decreased strength, Pain, Increased fascial restricitons  Visit  Diagnosis: Muscle weakness (generalized)  Acute pain of right knee  Other abnormalities of gait and mobility     Problem List Patient Active Problem List   Diagnosis Date Noted  . Left anterior cruciate ligament tear 09/08/2015  . ALLERGIC RHINITIS, SEASONAL 01/01/2011  . EPISTAXIS, RECURRENT 01/01/2011   Kerin Perna, PTA 01/22/19 10:37 AM  [facility] [place], [place], [address] Phone: [phone]   Fax:  [phone]  Name: ROME SCHLAUCH MRN: [id number] Date of Birth: [birth year]

## 2019-01-25 ENCOUNTER — Ambulatory Visit (INDEPENDENT_AMBULATORY_CARE_PROVIDER_SITE_OTHER): Payer: 59 | Admitting: Physical Therapy

## 2019-01-25 ENCOUNTER — Encounter: Payer: Self-pay | Admitting: Physical Therapy

## 2019-01-25 ENCOUNTER — Other Ambulatory Visit: Payer: Self-pay

## 2019-01-25 DIAGNOSIS — M6281 Muscle weakness (generalized): Secondary | ICD-10-CM | POA: Diagnosis not present

## 2019-01-25 DIAGNOSIS — R2689 Other abnormalities of gait and mobility: Secondary | ICD-10-CM | POA: Diagnosis not present

## 2019-01-25 DIAGNOSIS — M25561 Pain in right knee: Secondary | ICD-10-CM

## 2019-01-25 NOTE — Therapy (Signed)
[address] [place], [place], [address] Phone: [phone]   Fax:  [phone]  Physical Therapy Treatment  Patient Details  Name: Ashley Fischer MRN: [id number] Date of Birth: 2000/09/22 Referring Provider (PT): Dr Alphonzo Grieve    Encounter Date: 01/25/2019  PT End of Session - 01/25/19 1457    Visit Number  11    Number of Visits  22    Date for PT Re-Evaluation  02/26/19    PT Start Time  1402    PT Stop Time  1448    PT Time Calculation (min)  46 min    Activity Tolerance  Patient tolerated treatment well    Behavior During Therapy  [facility] for tasks assessed/performed       History reviewed. No pertinent past medical history.  Past Surgical History:  Procedure Laterality Date  . ARTHROSCOPIC REPAIR ACL      There were no vitals filed for this visit.  Subjective Assessment - 01/25/19 1407    Subjective  "I did the interval training on eliptical yesterday, and it was hard..but I could tell it is helping me get basketball ready".  Pt reports she is noticing a slow improvement in ability to jump from R leg.     Pertinent History  Lt ACL repair 11/14/15 with extensive rehab following surgery    Patient Stated Goals  return to play basketball     Currently in Pain?  No/denies    Pain Score  0-No pain         [facility] PT Assessment - 01/25/19 0001      Assessment   Medical Diagnosis  Rt knee scope     Referring Provider (PT)  Dr Alphonzo Grieve     Onset Date/Surgical Date  11/09/18   surgery - injury ~ 10/26/2018   Hand Dominance  Right    Next MD Visit  to be scheduled.       Flexibility   Quadriceps  Lt: 134 deg, Rt 145 deg       Special Tests   Other special tests  triple hop distance:  L- 120", R 98".  Single leg hop:  L- 66", R - 40"         [facility] Adult PT Treatment/Exercise - 01/25/19 0001      Knee/Hip Exercises: Stretches   Passive Hamstring Stretch  Both;2 reps;20  seconds   2 sets   Quad Stretch  Left;Right;2 reps;30 seconds   standing, 2 sets   ITB Stretch  Right;Left;20 seconds;2 reps   2 sets   Gastroc Stretch  Both;2 reps;30 seconds      Knee/Hip Exercises: Aerobic   Elliptical  5 min: interval training       Knee/Hip Exercises: Plyometrics   Bilateral Jumping  1 set;20 reps;10 reps;Box Height: 6"   forward hop ascend/descend steps   Bilateral Jumping Limitations  focus on even weight and soft landing    Unilateral Jumping Limitations  single leg hop (L x 5) R x 5 reps, 2 sets on stairs, then single leg hop with opp side arm reach x 10 rep RLE;     Other Plyometric Exercises  depth jumps from 10" step (hop off 10" step, then immediately jump up in air) x 10, repeated with forward jump x 5 reps     Other Plyometric Exercises  wall jumps x 30 (quick speed);         Knee/Hip Exercises: Standing  Forward Step Up  Right;Left   quick single let step ups, 45 sec   SLS  Rt Bulgarian split squat x 12, with Lt foot on black mat, eccentric lowering (limited depth) and explosive push up to start position.      Other Standing Knee Exercises  side shuffle x 25 ft x 4 reps;  jump squats x 2 reps each leg (trial for next visit)       Knee/Hip Exercises: Prone   Other Prone Exercises  supermans' with arms in goal post position x 15 reps               PT Short Term Goals - 01/15/19 1042      PT SHORT TERM GOAL #1   Title  Initial rehab program addressing ROM and active movement per protocol 12/22/2018    Time  4    Period  Weeks    Status  Achieved      PT SHORT TERM GOAL #2   Title  ROM Rt knee 0 degrees extension to 115 degrees flexion 12/22/2018    Time  4    Period  Weeks    Status  Achieved      PT SHORT TERM GOAL #3   Title  Improve gait pattern with patient to demonstrated equal stride length and weight bearing bilat LE's 12/22/2018    Baseline  improved with VC and repetition during therapy session    Time  4    Period  Weeks     Status  Partially Met      PT SHORT TERM GOAL #4   Title  I in initial HEP 12/22/2018    Time  4    Period  Weeks    Status  Achieved      PT SHORT TERM GOAL #5   Title  patient to demonstrated step over step ascending and descending stairs x 4 steps 12/22/2018    Time  4    Period  Weeks    Status  Achieved        PT Long Term Goals - 01/15/19 0913      PT LONG TERM GOAL #1   Title  I with advanced HEP 02/26/2019    Time  12    Period  Weeks    Status  Revised      PT LONG TERM GOAL #2   Title  increase Rt LE strength to 5/5 02/26/2019    Time  12    Period  Weeks    Status  Revised      PT LONG TERM GOAL #3   Title  Patient reports ability to ambulate for community distances including stairs 01/15/2019    Time  6    Period  Weeks    Status  Achieved      PT LONG TERM GOAL #4   Title  tolerate high level plyometrics without knee pain and good mechanics 02/26/2019    Baseline  pt reports some increase in pain in Rt knee; continues to have increased WB into L vs equal.     Time  12    Period  Weeks    Status  Revised      PT LONG TERM GOAL #5   Title  improve FOTO =/< 28% limitation 02/26/2019    Time  12    Period  Weeks    Status  Revised            Plan -  01/25/19 1728    Clinical Impression Statement  Pt demonstrated significant difference in single leg hop distance and triple hop distance.  Pt tolerated new plyometric and single leg exercises well, with minimal to no increase in Rt knee pain.  Agility drills are improving in quality of movement, however continues to req VC. Progressing well towards remaining goals.     Rehab Potential  Excellent    PT Frequency  2x / week    PT Duration  6 weeks    PT Treatment/Interventions  Patient/family education;ADLs/Self Care Home Management;Cryotherapy;Electrical Stimulation;Iontophoresis 20m/ml Dexamethasone;Moist Heat;Ultrasound;Dry needling;Manual techniques;Neuromuscular re-education;Balance training;Gait  training;Therapeutic activities;Therapeutic exercise    PT Next Visit Plan  continue Rt quad strengthening; sport specific drills.     Consulted and Agree with Plan of Care  Patient       Patient will benefit from skilled therapeutic intervention in order to improve the following deficits and impairments:  Abnormal gait, Decreased activity tolerance, Decreased endurance, Decreased range of motion, Decreased mobility, Decreased strength, Pain, Increased fascial restricitons  Visit Diagnosis: Muscle weakness (generalized)  Acute pain of right knee  Other abnormalities of gait and mobility     Problem List Patient Active Problem List   Diagnosis Date Noted  . Left anterior cruciate ligament tear 09/08/2015  . ALLERGIC RHINITIS, SEASONAL 01/01/2011  . EPISTAXIS, RECURRENT 01/01/2011   JKerin Perna PTA 01/25/19 5:30 PM  [address] [address]Phone: [phone]  Fax:  [phone] Name: Ashley TYGARTMRN: [id number] of Birth: 1December 19, 2002

## 2019-01-29 ENCOUNTER — Other Ambulatory Visit: Payer: Self-pay

## 2019-01-29 ENCOUNTER — Encounter: Payer: Self-pay | Admitting: Physical Therapy

## 2019-01-29 ENCOUNTER — Ambulatory Visit (INDEPENDENT_AMBULATORY_CARE_PROVIDER_SITE_OTHER): Payer: 59 | Admitting: Physical Therapy

## 2019-01-29 DIAGNOSIS — M6281 Muscle weakness (generalized): Secondary | ICD-10-CM | POA: Diagnosis not present

## 2019-01-29 DIAGNOSIS — M25561 Pain in right knee: Secondary | ICD-10-CM

## 2019-01-29 DIAGNOSIS — R2689 Other abnormalities of gait and mobility: Secondary | ICD-10-CM

## 2019-01-29 NOTE — Therapy (Signed)
[place], [place], [address] Phone: [phone]   Fax:  [phone]  Physical Therapy Treatment  Patient Details  Name: Ashley Fischer MRN: [id number] Date of Birth: 2000-11-06 Referring Provider (PT): Dr Alphonzo Grieve    Encounter Date: 01/29/2019  PT End of Session - 01/29/19 1130    Visit Number  12    Number of Visits  22    Date for PT Re-Evaluation  02/26/19    PT Start Time  1106    PT Stop Time  1155    PT Time Calculation (min)  49 min       History reviewed. No pertinent past medical history.  Past Surgical History:  Procedure Laterality Date  . ARTHROSCOPIC REPAIR ACL      There were no vitals filed for this visit.  Subjective Assessment - 01/29/19 1222    Subjective  Pt reports no new changes.     Currently in Pain?  Yes    Pain Score  1     Pain Location  Knee    Pain Orientation  Right    Pain Descriptors / Indicators  Dull    Aggravating Factors   stairs; certain movements    Pain Relieving Factors  rest, ice          [facility] PT Assessment - 01/29/19 0001      Assessment   Medical Diagnosis  Rt knee scope     Referring Provider (PT)  Dr Alphonzo Grieve     Onset Date/Surgical Date  11/09/18   surgery - injury ~ 10/26/2018   Hand Dominance  Right    Next MD Visit  01/31/19      Observation/Other Assessments   Focus on Therapeutic Outcomes (FOTO)   27% limitation      Strength   Right Knee Flexion  5/5    Right Knee Extension  --   5-/5   Left Knee Flexion  4-/5    Left Knee Extension  5/5      Special Tests   Other special tests  triple hop distance:  L- 139", R 107".  Single leg hop:  L- 58", R - 36"                    [facility] Adult PT Treatment/Exercise - 01/29/19 0001      Knee/Hip Exercises: Stretches   Passive Hamstring Stretch  Both;2 reps;20 seconds   2 sets   Quad Stretch  Left;Right;2 reps;30 seconds   standing, 2 sets    ITB Stretch  Right;Left;20 seconds;2 reps   2 sets   Gastroc Stretch  Both;2 reps;30 seconds      Knee/Hip Exercises: Aerobic   Elliptical  5 min: interval training       Knee/Hip Exercises: Machines for Strengthening   Total Gym Leg Press  RLE x 10 with 8 plates, 2 sets, then 20 pulses near TKE.        Knee/Hip Exercises: Plyometrics   Unilateral Jumping  --   10 ft distance   Unilateral Jumping Limitations  single leg hop (L x 5) R x 5 reps; repeated in forward pattern x 10 ft x 5 reps each leg.      Other Plyometric Exercises  depth jumps from 10" step (hop off 10" step, then immediately jump up in air) x 10, repeated with forward jump x 5 reps       Knee/Hip Exercises: Standing  Side Lunges  Right;Left;1 set;10 reps   holding 10# to touch floor, then hip abdct   SLS  Rt/Lt [place] split squat x 12, with opp foot on black mat, eccentric lowering (limited depth) and explosive push up to start position.; mirror and cues for form.       Other Standing Knee Exercises  side shuffle with quick speed  x 15 ft followed by vertical jump x 10 reps each direction       Knee/Hip Exercises: Sidelying   Other Sidelying Knee/Hip Exercises  side plank x 15 sec, followed by 10 pulsed to mat - each side      Knee/Hip Exercises: Prone   Other Prone Exercises  supermans' with arms in goal post position x 15 reps             PT Education - 01/29/19 1222    Education Details  reviewed HEP    Person(s) Educated  Patient    Methods  Explanation    Comprehension  Verbalized understanding       PT Short Term Goals - 01/15/19 1042      PT SHORT TERM GOAL #1   Title  Initial rehab program addressing ROM and active movement per protocol 12/22/2018    Time  4    Period  Weeks    Status  Achieved      PT SHORT TERM GOAL #2   Title  ROM Rt knee 0 degrees extension to 115 degrees flexion 12/22/2018    Time  4    Period  Weeks    Status  Achieved      PT SHORT TERM GOAL #3   Title   Improve gait pattern with patient to demonstrated equal stride length and weight bearing bilat LE's 12/22/2018    Baseline  improved with VC and repetition during therapy session    Time  4    Period  Weeks    Status  Partially Met      PT SHORT TERM GOAL #4   Title  I in initial HEP 12/22/2018    Time  4    Period  Weeks    Status  Achieved      PT SHORT TERM GOAL #5   Title  patient to demonstrated step over step ascending and descending stairs x 4 steps 12/22/2018    Time  4    Period  Weeks    Status  Achieved        PT Long Term Goals - 01/29/19 1121      PT LONG TERM GOAL #1   Title  I with advanced HEP 02/26/2019    Time  12    Period  Weeks    Status  On-going      PT LONG TERM GOAL #2   Title  increase Rt LE strength to 5/5 02/26/2019    Time  12    Period  Weeks    Status  Partially Met      PT LONG TERM GOAL #3   Title  Patient reports ability to ambulate for community distances including stairs 01/15/2019    Time  6    Period  Weeks    Status  Achieved      PT LONG TERM GOAL #4   Title  tolerate high level plyometrics without knee pain and good mechanics 02/26/2019    Baseline  pt reports mild increase in pain in Rt knee with jumping; mechanics and form improving.  Time  12    Period  Weeks    Status  Partially Met      PT LONG TERM GOAL #5   Title  improve FOTO =/< 28% limitation 02/26/2019    Time  12    Period  Weeks    Status  Achieved            Plan - 01/29/19 1212    Clinical Impression Statement  Pt demonstrated improved Rt knee strength since last assessement; continues with mild deficit with TKE.  She demonstrated significant difference in single leg hop distance and triple hop distance. Pt has tolerated plyometric and single leg exercises well, with minimal in Rt knee pain. Agility drills are improving in quality of movement, however continues to req VC for improved form. Pt has partially met her rehab goals.     Rehab Potential  Excellent     PT Frequency  2x / week    PT Duration  6 weeks    PT Treatment/Interventions  Patient/family education;ADLs/Self Care Home Management;Cryotherapy;Electrical Stimulation;Iontophoresis 26m/ml Dexamethasone;Moist Heat;Ultrasound;Dry needling;Manual techniques;Neuromuscular re-education;Balance training;Gait training;Therapeutic activities;Therapeutic exercise    PT Next Visit Plan  Await further advisement from MD at upcoming appt.      Consulted and Agree with Plan of Care  Patient       Patient will benefit from skilled therapeutic intervention in order to improve the following deficits and impairments:  Abnormal gait, Decreased activity tolerance, Decreased endurance, Decreased range of motion, Decreased mobility, Decreased strength, Pain, Increased fascial restricitons  Visit Diagnosis: Muscle weakness (generalized)  Acute pain of right knee  Other abnormalities of gait and mobility     Problem List Patient Active Problem List   Diagnosis Date Noted  . Left anterior cruciate ligament tear 09/08/2015  . ALLERGIC RHINITIS, SEASONAL 01/01/2011  . EPISTAXIS, RECURRENT 01/01/2011   JKerin Perna PTA 01/29/19 12:23 PM  [address] [place] [address]Phone: [phone]  Fax:  [phone] Name: Ashley EDELENMRN: [id number] of Birth: [birth year]

## 2019-02-01 ENCOUNTER — Other Ambulatory Visit: Payer: Self-pay

## 2019-02-01 ENCOUNTER — Ambulatory Visit (INDEPENDENT_AMBULATORY_CARE_PROVIDER_SITE_OTHER): Payer: 59 | Admitting: Physical Therapy

## 2019-02-01 ENCOUNTER — Encounter: Payer: Self-pay | Admitting: Physical Therapy

## 2019-02-01 DIAGNOSIS — M25561 Pain in right knee: Secondary | ICD-10-CM

## 2019-02-01 DIAGNOSIS — M6281 Muscle weakness (generalized): Secondary | ICD-10-CM

## 2019-02-01 NOTE — Therapy (Addendum)
[place] [address], [place], [address] Phone: [phone]   Fax:  [phone]  Physical Therapy Treatment  Patient Details  Name: ANDREY HOOBLER MRN: [id number] Date of Birth: 22-Dec-2000 Referring Provider (PT): Dr Alphonzo Grieve    Encounter Date: 02/01/2019  PT End of Session - 02/01/19 1413    Visit Number  13    Number of Visits  22    Date for PT Re-Evaluation  02/26/19    PT Start Time  2202    PT Stop Time  1450    PT Time Calculation (min)  46 min    Activity Tolerance  Patient tolerated treatment well    Behavior During Therapy  [facility] for tasks assessed/performed       History reviewed. No pertinent past medical history.  Past Surgical History:  Procedure Laterality Date  . ARTHROSCOPIC REPAIR ACL      There were no vitals filed for this visit.  Subjective Assessment - 02/01/19 1414    Subjective  Pt reports she visited MD yesterday. She states he is recommending surgery to repair the cartilage in Rt knee (extensive repair). She stated MD recommended continued strengthening with low impact exercises.  She is getting a second opinion on 02/15/19.     Patient Stated Goals  return to play basketball     Currently in Pain?  No/denies    Pain Score  0-No pain         [facility] PT Assessment - 02/01/19 0001      Assessment   Medical Diagnosis  Rt knee scope     Referring Provider (PT)  Dr Alphonzo Grieve     Onset Date/Surgical Date  11/09/18   surgery - injury ~ 10/26/2018   Hand Dominance  Right       [facility] Adult PT Treatment/Exercise - 02/01/19 0001      Knee/Hip Exercises: Stretches   Passive Hamstring Stretch  Both;2 reps;20 seconds   2 sets   Sports administrator  Left;Right;2 reps;30 seconds   standing, 2 sets   ITB Stretch  Right;Left;20 seconds;2 reps   2 sets   Gastroc Stretch  Both;2 reps;30 seconds      Knee/Hip Exercises: Aerobic   Elliptical  5 min: interval training        Knee/Hip Exercises: Standing   Side Lunges  Right;Left;1 set;10 reps   holding 10# to touch floor, then hip abdct   Lateral Step Up  Right;2 sets;Hand Hold: 1;Step Height: 8"   cross over step ups. 2nd set on 10" step   Step Down  Left;1 set;10 reps;Hand Hold: 0;Step Height: 8"   and retro step up with RLE   Step Down Limitations  mirror for feedback, VC to avoid compensatory strategies in Rt hip    Functional Squat Limitations  squat to standing hip abdct x 10 each side, touching floor with 10# weight during squat    SLS  Rt/Lt [place] split squat x 12, with opp foot on black mat, eccentric lowering (limited depth) and explosive push up to start position.; mirror and cues for form.       Other Standing Knee Exercises  curtsy lunges for RLE only (with LLE on slider) x 10 reps    Other Standing Knee Exercises  fitter with black/blue band x 2 min (light UE support)       Knee/Hip Exercises: Seated   Stool Scoot - Round Trips  10 ft on carpet, each  leg 4 reps       Knee/Hip Exercises: Supine   Straight Leg Raise with External Rotation Limitations  long sitting SLR on RLE with hip abd/add x 10   RLE only.      Knee/Hip Exercises: Prone   Other Prone Exercises  plank with Lt hip ext x 10; plank with spider leg x 10, then with cross knees x 10      Modalities   Modalities  --   pt declined, will ice at home.               PT Short Term Goals - 01/15/19 1042      PT SHORT TERM GOAL #1   Title  Initial rehab program addressing ROM and active movement per protocol 12/22/2018    Time  4    Period  Weeks    Status  Achieved      PT SHORT TERM GOAL #2   Title  ROM Rt knee 0 degrees extension to 115 degrees flexion 12/22/2018    Time  4    Period  Weeks    Status  Achieved      PT SHORT TERM GOAL #3   Title  Improve gait pattern with patient to demonstrated equal stride length and weight bearing bilat LE's 12/22/2018    Baseline  improved with VC and repetition during  therapy session    Time  4    Period  Weeks    Status  Partially Met      PT SHORT TERM GOAL #4   Title  I in initial HEP 12/22/2018    Time  4    Period  Weeks    Status  Achieved      PT SHORT TERM GOAL #5   Title  patient to demonstrated step over step ascending and descending stairs x 4 steps 12/22/2018    Time  4    Period  Weeks    Status  Achieved        PT Long Term Goals - 01/29/19 1121      PT LONG TERM GOAL #1   Title  I with advanced HEP 02/26/2019    Time  12    Period  Weeks    Status  On-going      PT LONG TERM GOAL #2   Title  increase Rt LE strength to 5/5 02/26/2019    Time  12    Period  Weeks    Status  Partially Met      PT LONG TERM GOAL #3   Title  Patient reports ability to ambulate for community distances including stairs 01/15/2019    Time  6    Period  Weeks    Status  Achieved      PT LONG TERM GOAL #4   Title  tolerate high level plyometrics without knee pain and good mechanics 02/26/2019    Baseline  pt reports mild increase in pain in Rt knee with jumping; mechanics and form improving.     Time  12    Period  Weeks    Status  Partially Met      PT LONG TERM GOAL #5   Title  improve FOTO =/< 28% limitation 02/26/2019    Time  12    Period  Weeks    Status  Achieved            Plan - 02/01/19 1432    Clinical Impression Statement  Pt tolerated all exercises well, without increase in pain.  Plyometrics held this session due to advisement from MD. Pt has partially met her goals. Will hold therapy until pt gets 2nd opinion; discussed at length the HEP pt to do in the interim.     PT Frequency  2x / week    PT Duration  6 weeks    PT Treatment/Interventions  Patient/family education;ADLs/Self Care Home Management;Cryotherapy;Electrical Stimulation;Iontophoresis 24m/ml Dexamethasone;Moist Heat;Ultrasound;Dry needling;Manual techniques;Neuromuscular re-education;Balance training;Gait training;Therapeutic activities;Therapeutic exercise    PT  Next Visit Plan  spoke to supervising PT; will hold therapy at this time.     Consulted and Agree with Plan of Care  Patient       Patient will benefit from skilled therapeutic intervention in order to improve the following deficits and impairments:  Abnormal gait, Decreased activity tolerance, Decreased endurance, Decreased range of motion, Decreased mobility, Decreased strength, Pain, Increased fascial restricitons  Visit Diagnosis: Muscle weakness (generalized)  Acute pain of right knee     Problem List Patient Active Problem List   Diagnosis Date Noted  . Left anterior cruciate ligament tear 09/08/2015  . ALLERGIC RHINITIS, SEASONAL 01/01/2011  . EPISTAXIS, RECURRENT 01/01/2011   JKerin Perna PTA 02/01/19 3:20 PM  [facility] [address] [address] [address]Phone: [phone]  Fax:  [phone] Name: AGARA KINCADEMRN: [id number] of Birth: [birth year] PHYSICAL THERAPY DISCHARGE SUMMARY  Visits from Start of Care: 13  Current functional level related to goals / functional outcomes: See progress note for discharge status    Remaining deficits: Unknown    Education / Equipment: HEP  Plan: Patient agrees to discharge.  Patient goals were not met. Patient is being discharged due to not returning since the last visit.  ?????     Celyn P. HHelene KelpPT, MPH 03/22/19 9:49 AM

## 2019-08-06 ENCOUNTER — Ambulatory Visit (INDEPENDENT_AMBULATORY_CARE_PROVIDER_SITE_OTHER): Payer: 59 | Admitting: Physical Therapy

## 2019-08-06 ENCOUNTER — Encounter: Payer: Self-pay | Admitting: Physical Therapy

## 2019-08-06 ENCOUNTER — Other Ambulatory Visit: Payer: Self-pay

## 2019-08-06 DIAGNOSIS — M25661 Stiffness of right knee, not elsewhere classified: Secondary | ICD-10-CM | POA: Diagnosis not present

## 2019-08-06 DIAGNOSIS — M6281 Muscle weakness (generalized): Secondary | ICD-10-CM | POA: Diagnosis not present

## 2019-08-06 DIAGNOSIS — R2689 Other abnormalities of gait and mobility: Secondary | ICD-10-CM | POA: Diagnosis not present

## 2019-08-06 DIAGNOSIS — M25561 Pain in right knee: Secondary | ICD-10-CM

## 2019-08-06 DIAGNOSIS — R600 Localized edema: Secondary | ICD-10-CM

## 2019-08-06 NOTE — Therapy (Signed)
[facility] [place] [address] [place], [place], [address] Phone: [phone]   Fax:  [phone]  Physical Therapy Evaluation  Patient Details  Name: Ashley Fischer MRN: [id number] Date of Birth: [birth year] Referring Provider (PT): Reed Breech MD   Encounter Date: 08/06/2019  PT End of Session - 08/06/19 1020    Visit Number  1    Number of Visits  24    Date for PT Re-Evaluation  10/29/19    PT Start Time  1021    PT Stop Time  1105    PT Time Calculation (min)  44 min    Activity Tolerance  Patient tolerated treatment well    Behavior During Therapy  [facility] for tasks assessed/performed       History reviewed. No pertinent past medical history.  Past Surgical History:  Procedure Laterality Date  . ARTHROSCOPIC REPAIR ACL      There were no vitals filed for this visit.   Subjective Assessment - 08/06/19 1026    Subjective  Patient hurt her right knee in February and had a knee scope. 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   No    Is the patient reluctant to leave their home because of a fear of falling?   No      Home Environment   Living Environment  Private residence    Additional Comments  bedroom on 2nd floor      Prior Function   Level of Independence  Independent    Vocation  Student    Leisure  basketball       Observation/Other Assessments-Edema    Edema  Circumferential      Circumferential Edema   Circumferential - Right  44 cm    Circumferential - Left   41.5 cm  Sensation   Light Touch  Impaired by gross assessment      ROM / Strength   AROM / PROM / Strength  PROM;Strength      PROM   PROM Assessment Site  Knee    Right/Left Knee  Right    Right Knee Extension  0   active -2 deg   Right Knee Flexion  38      Strength   Overall Strength Comments  poor quad set      Flexibility   Soft Tissue Assessment /Muscle Length  --   mild tightness right gastroc and HS     Palpation   Patella mobility  WNL    Palpation comment  tender around incisions; decreased sensation in medial thigh/knee and later lower leg      Transfers   Comments  requires BUE assist for sit to supine transfer      Ambulation/Gait   Gait Comments  amb safely with bil crutches                Objective measurements completed on examination: See above findings.      [facility] Adult PT Treatment/Exercise - 08/06/19 0001      Self-Care   Self-Care  Heat/Ice Application;Other Self-Care Comments    Heat/Ice Application  advised 20-30 min after CPM use vs constant     Other Self-Care Comments   removed and replaced surgical dressing  with 4x4 gauze and ACE wrap provided by pt             PT Education -  08/06/19 1317    Education Details  Reviewed HEP from protocol; holding SLR until next visit    Person(s) Educated  Patient    Methods  Explanation;Demonstration;Handout    Comprehension  Verbalized understanding;Returned demonstration       PT Short Term Goals - 08/06/19 1341      PT SHORT TERM GOAL #1   Title  Initial rehab program addressing ROM and active movement per protocol    Time  4    Period  Weeks    Status  New    Target Date  09/03/19      PT SHORT TERM GOAL #2   Title  ROM Rt knee 0 degrees extension to 90 degrees flexion    Time  4    Period  Weeks    Status  New    Target Date  09/03/19      PT SHORT TERM GOAL #3   Title  Patient able to tolerate 50% WB on RLE with gait    Baseline  -----    Time  6    Period  Weeks    Status  New    Target Date  09/17/19      PT SHORT TERM GOAL #4   Title  decreased inflammation in Rt knee to within 1 cm of left knee    Time  6    Period  Weeks    Status  New    Target Date  09/17/19      PT SHORT TERM GOAL #5   Title  -----------        PT Long Term Goals - 08/06/19 1347      PT LONG TERM GOAL #1   Title  I with advanced HEP    Time  12    Period  Weeks    Status  New    Target Date  10/29/19  PT LONG TERM GOAL #2   Title  increase Rt LE strength to 5/5    Time  12    Period  Weeks    Status  New      PT LONG TERM GOAL #3   Title  Improved right knee flexion to 100 deg    Time  12    Period  Weeks    Status  New    Target Date  10/29/19      PT LONG TERM GOAL #4   Title  Patient to demo no extensor lag with SLR    Time  12    Period  Weeks    Status  New    Target Date  10/29/19      PT LONG TERM GOAL #5   Title  Patient able to ambulate with a normal gait pattern and climb stairs without difficulty.    Time  12    Period  Weeks    Status  New    Target Date  10/29/19             Plan - 08/06/19 1325    Clinical Impression Statement  Patient presents s/p surgery for closed  osteochondral fx of distal end of right femur with nonunion on 08/03/19. She is NWB with bil crutches for 4 weeks and wears a brace locked in extension. She has limited right knee flex and weakness with bed mobility. Her dressing was changed today and her incisions are healing well. She has localized edema in her right knee and decreased sensation in her proximal/medial thigh and lateral lower leg. She is using her CPM machine for 2 hours, 4 x/day. She will benefit from PT to regain strength and ROM, normalize gait and stairs and ultimately return to playing basketball.    Examination-Activity Limitations  Stairs    Stability/Clinical Decision Making  Stable/Uncomplicated    Clinical Decision Making  Low    Rehab Potential  Excellent    PT Frequency  2x / week    PT Duration  12 weeks    PT Treatment/Interventions  ADLs/Self Care Home Management;Cryotherapy;Occupational psychologist;Therapeutic exercise;Balance training;Neuromuscular re-education;Patient/family education;Manual techniques;Scar mobilization;Passive range of motion;Taping;Vasopneumatic Device    PT Next Visit Plan  See protocol for approved exercises through 4 weeks; no heel slides until 2 wks out,    PT Home Exercise Plan  ankle pumps, quad sets x 5 sec, calf stretch with towel, HS stretch with ice, hip ABD with brace    Consulted and Agree with Plan of Care  Patient       Patient will benefit from skilled therapeutic intervention in order to improve the following deficits and impairments:  Impaired sensation, Decreased range of motion, Decreased strength, Decreased activity tolerance, Increased edema, Pain, Difficulty walking  Visit Diagnosis: Stiffness of right knee, not elsewhere classified - Plan: PT plan of care cert/re-cert  Acute pain of right knee - Plan: PT plan of care cert/re-cert  Muscle weakness (generalized) - Plan: PT plan of care cert/re-cert  Other abnormalities of gait and mobility -  Plan: PT plan of care cert/re-cert  Localized edema - Plan: PT plan of care cert/re-cert     Problem List Patient Active Problem List   Diagnosis Date Noted  . Left anterior cruciate ligament tear 09/08/2015  . ALLERGIC RHINITIS, SEASONAL 01/01/2011  . EPISTAXIS, RECURRENT 01/01/2011    Madelyn Flavors PT 08/06/2019, 2:13 PM  [place] [address]  [address] [place], [place], [address] Phone: [phone]   Fax:  [phone]  Name: Ashley Fischer MRN: [id number] Date of Birth: 29-Aug-2001

## 2019-08-08 ENCOUNTER — Other Ambulatory Visit: Payer: Self-pay

## 2019-08-08 ENCOUNTER — Ambulatory Visit (INDEPENDENT_AMBULATORY_CARE_PROVIDER_SITE_OTHER): Payer: 59 | Admitting: Physical Therapy

## 2019-08-08 ENCOUNTER — Encounter (INDEPENDENT_AMBULATORY_CARE_PROVIDER_SITE_OTHER): Payer: Self-pay

## 2019-08-08 DIAGNOSIS — M25661 Stiffness of right knee, not elsewhere classified: Secondary | ICD-10-CM

## 2019-08-08 DIAGNOSIS — R600 Localized edema: Secondary | ICD-10-CM

## 2019-08-08 DIAGNOSIS — M25561 Pain in right knee: Secondary | ICD-10-CM | POA: Diagnosis not present

## 2019-08-08 DIAGNOSIS — M6281 Muscle weakness (generalized): Secondary | ICD-10-CM | POA: Diagnosis not present

## 2019-08-08 NOTE — Therapy (Signed)
[place], [place], [address] Phone: [phone]   Fax:  [phone]  Physical Therapy Treatment  Patient Details  Name: Ashley Fischer MRN: [id number] Date of Birth: Aug 14, 2001 Referring Provider (PT): Erie Noe MD   Encounter Date: 08/08/2019  PT End of Session - 08/08/19 0943    Visit Number  2    Number of Visits  24    Date for PT Re-Evaluation  10/29/19    PT Start Time  0853    PT Stop Time  0931    PT Time Calculation (min)  38 min    Activity Tolerance  Patient tolerated treatment well;No increased pain    Behavior During Therapy  [facility] for tasks assessed/performed       No past medical history on file.  Past Surgical History:  Procedure Laterality Date  . ARTHROSCOPIC REPAIR ACL      There were no vitals filed for this visit.  Subjective Assessment - 08/08/19 0944    Subjective  Pt reports that the yellow guaze covering staples on (lateral) part of knee has rolled down and it feels sensitive.  She requests help with ace wrap.  CPM up 40 deg.    Currently in Pain?  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 (P95.093O)    Referring Provider (PT)  Erie Noe MD    Onset Date/Surgical Date  08/03/19    Next MD Visit  08/15/19    Prior Therapy  yes      Restrictions   RLE Weight Bearing  Non weight bearing       [facility] Adult PT Treatment/Exercise - 08/08/19 0001      Bed Mobility   Bed Mobility  Supine to Sit;Sit to Supine    Supine to Sit  Supervision/Verbal cueing   cues to use LLE to assist RLE to floor   Sit to Supine  Minimal Assistance - Patient > 75%   assist for RLE onto table     Self-Care   Self-Care  Other Self-Care Comments    Other  Self-Care Comments   replaced 4x4 gauze and rewrapped Rt knee with ACE wrap provided by pt;  educated pt on use of LLE to progress RLE into/ out of bed and car; pt verbalized understanding.       Knee/Hip Exercises: Stretches   Press photographer  Right;3 reps;30 seconds   long sitting with strap     Knee/Hip Exercises: Supine   Quad Sets  Strengthening;Right;2 sets   5 sec x 5, 10 sec x 5    Straight Leg Raises  AAROM;Right;1 set;10 reps    Knee Extension  PROM;Right;1 set;10 reps    Knee Flexion  PROM;Right;10 reps      Knee/Hip Exercises: Sidelying   Hip ABduction  Strengthening;Right;1 set;10 reps      Knee/Hip Exercises: Prone   Hip Extension  Strengthening;Right;10 reps      Modalities   Modalities  Vasopneumatic      Vasopneumatic   Number Minutes Vasopneumatic   10 minutes    Vasopnuematic Location   Knee   Rt  Vasopneumatic Pressure  Medium    Vasopneumatic Temperature   34 deg       Ankle Exercises: Supine   T-Band  Rt PF with green band x 20 reps (long sitting)    Other Supine Ankle Exercises  Rt ankle circles x 10; reviewed ABCs with ankle.                PT Short Term Goals - 08/06/19 1341      PT SHORT TERM GOAL #1   Title  Initial rehab program addressing ROM and active movement per protocol    Time  4    Period  Weeks    Status  New    Target Date  09/03/19      PT SHORT TERM GOAL #2   Title  ROM Rt knee 0 degrees extension to 90 degrees flexion    Time  4    Period  Weeks    Status  New    Target Date  09/03/19      PT SHORT TERM GOAL #3   Title  Patient able to tolerate 50% WB on RLE with gait    Baseline  -----    Time  6    Period  Weeks    Status  New    Target Date  09/17/19      PT SHORT TERM GOAL #4   Title  decreased inflammation in Rt knee to within 1 cm of left knee    Time  6    Period  Weeks    Status  New    Target Date  09/17/19      PT SHORT TERM GOAL #5   Title  -----------        PT Long Term Goals -  08/06/19 1347      PT LONG TERM GOAL #1   Title  I with advanced HEP    Time  12    Period  Weeks    Status  New    Target Date  10/29/19      PT LONG TERM GOAL #2   Title  increase Rt LE strength to 5/5    Time  12    Period  Weeks    Status  New      PT LONG TERM GOAL #3   Title  Improved right knee flexion to 100 deg    Time  12    Period  Weeks    Status  New    Target Date  10/29/19      PT LONG TERM GOAL #4   Title  Patient to demo no extensor lag with SLR    Time  12    Period  Weeks    Status  New    Target Date  10/29/19      PT LONG TERM GOAL #5   Title  Patient able to ambulate with a normal gait pattern and climb stairs without difficulty.    Time  12    Period  Weeks    Status  New    Target Date  10/29/19            Plan - 08/08/19 0947    Clinical Impression Statement  Pt demonstrated weak quad set; substitutes glute set and requires tactile cues to engage.  Pt requires assistance to initiate and sustain SLR, but was able to complete hip ext and hip abdct SLR without difficulty. Rt knee ace wrap reapplied with  4x4 guaze covering staples. Pt able to progress RLE (with brace on) with use of LLE by end of session. Goals are ongoing.    Rehab Potential  Excellent    PT Frequency  2x / week    PT Duration  12 weeks    PT Treatment/Interventions  ADLs/Self Care Home Management;Cryotherapy;Retail banker;Therapeutic exercise;Balance training;Neuromuscular re-education;Patient/family education;Manual techniques;Scar mobilization;Passive range of motion;Taping;Vasopneumatic Device    PT Next Visit Plan  See protocol for approved exercises through 4 weeks; no heel slides until 2 wks out,    PT Home Exercise Plan  ankle pumps, quad sets x 5 sec, calf stretch with towel, HS stretch with ice, hip ABD with brace       Patient will benefit from skilled therapeutic intervention in order to improve the following deficit s and  impairments:  Impaired sensation, Decreased range of motion, Decreased strength, Decreased activity tolerance, Increased edema, Pain, Difficulty walking  Visit Diagnosis: Stiffness of right knee, not elsewhere classified  Acute pain of right knee  Muscle weakness (generalized)  Localized edema     Problem List Patient Active Problem List   Diagnosis Date Noted  . Left anterior cruciate ligament tear 09/08/2015  . ALLERGIC RHINITIS, SEASONAL 01/01/2011  . EPISTAXIS, RECURRENT 01/01/2011   Mayer Camel, PTA 08/08/19 10:12 AM  [facility] [place] [address] [address] [place], [place], [address] Phone: [phone]   Fax:  [phone]  Name: CAMRIN LAPRE MRN: [id number] Date of Birth: [birth year]

## 2019-08-14 ENCOUNTER — Other Ambulatory Visit: Payer: Self-pay

## 2019-08-14 ENCOUNTER — Encounter: Payer: Self-pay | Admitting: Physical Therapy

## 2019-08-14 ENCOUNTER — Ambulatory Visit (INDEPENDENT_AMBULATORY_CARE_PROVIDER_SITE_OTHER): Payer: 59 | Admitting: Physical Therapy

## 2019-08-14 DIAGNOSIS — M6281 Muscle weakness (generalized): Secondary | ICD-10-CM

## 2019-08-14 DIAGNOSIS — M25561 Pain in right knee: Secondary | ICD-10-CM | POA: Diagnosis not present

## 2019-08-14 DIAGNOSIS — R600 Localized edema: Secondary | ICD-10-CM

## 2019-08-14 DIAGNOSIS — M25661 Stiffness of right knee, not elsewhere classified: Secondary | ICD-10-CM

## 2019-08-14 NOTE — Therapy (Addendum)
[place], [place], [address] Phone: [phone]   Fax:  [phone]  Physical Therapy Treatment  Patient Details  Name: Ashley Fischer MRN: [id number] Date of Birth: 31-Aug-2001 Referring Provider (PT): Erie Noe MD   Encounter Date: 08/14/2019  PT End of Session - 08/14/19 1404    Visit Number  3    Number of Visits  24    Date for PT Re-Evaluation  10/29/19    PT Start Time  8891    PT Stop Time  1448    PT Time Calculation (min)  45 min    Activity Tolerance  Patient tolerated treatment well;No increased pain    Behavior During Therapy  [facility] for tasks assessed/performed       History reviewed. No pertinent past medical history.  Past Surgical History:  Procedure Laterality Date  . ARTHROSCOPIC REPAIR ACL      There were no vitals filed for this visit.  Subjective Assessment - 08/14/19 1404    Subjective  Pt states that there have been no changes with Rt LE. She is up to 55-60 degrees in CPM 8hrs/day.    Currently in Pain?  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)    Referring Provider (PT)  Erie Noe MD    Onset Date/Surgical Date  08/03/19    Next MD Visit  08/15/19    Prior Therapy  yes      PROM   Right Knee Extension  0    Right Knee Flexion  73        [facility] Adult PT Treatment/Exercise - 08/14/19 0001      Knee/Hip Exercises: Stretches   Passive Hamstring Stretch  Right;3 reps;30 seconds   supine with strap   Gastroc Stretch  Right;3 reps;30 seconds   long sitting with strap     Knee/Hip Exercises: Supine   Quad Sets  Strengthening;Right;10 reps   10 sec hold   Quad Sets Limitations  first set with assistance from SPTA, 2nd set able to complete independently    Straight Leg Raises  AAROM;Strengthening;Right;2 sets;10 reps   with  strap   Knee Extension  PROM;Right;1 set;10 reps    Knee Flexion  PROM;Right;10 reps      Knee/Hip Exercises: Sidelying   Hip ABduction  Strengthening;Right;1 set;10 reps   knee brace on   Hip ADduction  Strengthening;Right;1 set;10 reps   knee brace on      Knee/Hip Exercises: Prone   Hip Extension  Strengthening;Right;10 reps   knee brace on     Modalities   Modalities  Vasopneumatic      Vasopneumatic   Number Minutes Vasopneumatic   10 minutes    Vasopnuematic Location   Knee   Rt   Vasopneumatic Pressure  Medium    Vasopneumatic Temperature   34 deg       Ankle Exercises: Supine   T-Band  Rt PF with green band x 10 reps, with blue band x 10, with black band x 10 reps (long sitting)               PT Short Term Goals - 08/14/19 1543      PT SHORT TERM GOAL #1   Title  Initial rehab program addressing ROM and active movement  per protocol    Time  4    Period  Weeks    Status  On-going    Target Date  09/03/19      PT SHORT TERM GOAL #2   Title  ROM Rt knee 0 degrees extension to 90 degrees flexion    Time  4    Period  Weeks    Status  Partially Met    Target Date  09/03/19      PT SHORT TERM GOAL #3   Title  Patient able to tolerate 50% WB on RLE with gait    Baseline  -----    Time  6    Period  Weeks    Status  On-going    Target Date  09/17/19      PT SHORT TERM GOAL #4   Title  decreased inflammation in Rt knee to within 1 cm of left knee    Time  6    Period  Weeks    Status  On-going    Target Date  09/17/19      PT SHORT TERM GOAL #5   Title  -----------        PT Long Term Goals - 08/06/19 1347      PT LONG TERM GOAL #1   Title  I with advanced HEP    Time  12    Period  Weeks    Status  New    Target Date  10/29/19      PT LONG TERM GOAL #2   Title  increase Rt LE strength to 5/5    Time  12    Period  Weeks    Status  New      PT LONG TERM GOAL #3   Title  Improved right knee flexion to 100 deg    Time  12     Period  Weeks    Status  New    Target Date  10/29/19      PT LONG TERM GOAL #4   Title  Patient to demo no extensor lag with SLR    Time  12    Period  Weeks    Status  New    Target Date  10/29/19      PT LONG TERM GOAL #5   Title  Patient able to ambulate with a normal gait pattern and climb stairs without difficulty.    Time  12    Period  Weeks    Status  New    Target Date  10/29/19            Plan - 08/14/19 1456    Clinical Impression Statement  Pt remains NWB with RLE in knee brace (locked in ext) with bilateral axillary crutches.  She demonstrated improved ability to perform SLR today; initially required assistance to initiate, but able to complete independently after 10 reps. Pt tolerated PROM to Rt LE with no pain; within protocol limits. Her Rt LE PROM flexion has increased to 68 degrees; has partially met STG # 2. Continues to make good progress toward all goals; continued skilled PT for improved functional mobility.    Rehab Potential  Excellent    PT Frequency  2x / week    PT Duration  12 weeks    PT Treatment/Interventions  ADLs/Self Care Home Management;Cryotherapy;Occupational psychologist;Therapeutic exercise;Balance training;Neuromuscular re-education;Patient/family education;Manual techniques;Scar mobilization;Passive range of motion;Taping;Vasopneumatic Device    PT Next Visit Plan  See protocol for  approved exercises through 4 weeks; no heel slides until 2 wks out. NWB until 6 wks.    PT Home Exercise Plan  ankle pumps, quad sets x 5 sec, calf stretch with towel, HS stretch with ice, hip ABD with brace       Patient will benefit from skilled therapeutic intervention in order to improve the following deficits and impairments:  Impaired sensation, Decreased range of motion, Decreased strength, Decreased activity tolerance, Increased edema, Pain, Difficulty walking  Visit Diagnosis: Stiffness of right knee, not elsewhere  classified  Acute pain of right knee  Muscle weakness (generalized)  Localized edema     Problem List Patient Active Problem List   Diagnosis Date Noted  . Left anterior cruciate ligament tear 09/08/2015  . ALLERGIC RHINITIS, SEASONAL 01/01/2011  . EPISTAXIS, RECURRENT 01/01/2011    Gardiner Rhyme, SPTA 08/14/2019, 4:26 PM  Kerin Perna, PTA 08/14/19 4:26 PM  [place], [place], [address] Phone: [phone]   Fax:  [phone]  Name: NIOMI VALENT MRN: [id number] Date of Birth: 08/29/2001

## 2019-08-20 ENCOUNTER — Ambulatory Visit (INDEPENDENT_AMBULATORY_CARE_PROVIDER_SITE_OTHER): Payer: 59 | Admitting: Physical Therapy

## 2019-08-20 ENCOUNTER — Encounter: Payer: Self-pay | Admitting: Physical Therapy

## 2019-08-20 ENCOUNTER — Other Ambulatory Visit: Payer: Self-pay

## 2019-08-20 DIAGNOSIS — M6281 Muscle weakness (generalized): Secondary | ICD-10-CM

## 2019-08-20 DIAGNOSIS — R600 Localized edema: Secondary | ICD-10-CM

## 2019-08-20 DIAGNOSIS — M25561 Pain in right knee: Secondary | ICD-10-CM | POA: Diagnosis not present

## 2019-08-20 DIAGNOSIS — M25661 Stiffness of right knee, not elsewhere classified: Secondary | ICD-10-CM

## 2019-08-20 NOTE — Therapy (Signed)
[place], [place], [address] Phone: [phone]   Fax:  [phone]  Physical Therapy Treatment  Patient Details  Name: Ashley Fischer MRN: [id number] Date of Birth: Feb 04, 2001 Referring Provider (PT): Erie Noe MD   Encounter Date: 08/20/2019  PT End of Session - 08/20/19 1522    Visit Number  4    Number of Visits  24    Date for PT Re-Evaluation  10/29/19    PT Start Time  0240    PT Stop Time  1600    PT Time Calculation (min)  37 min    Activity Tolerance  Patient tolerated treatment well;No increased pain    Behavior During Therapy  [facility] for tasks assessed/performed       History reviewed. No pertinent past medical history.  Past Surgical History:  Procedure Laterality Date  . ARTHROSCOPIC REPAIR ACL      There were no vitals filed for this visit.  Subjective Assessment - 08/20/19 1524    Subjective  Pt states that she had her staples removed. Last time she used the cpm she was at 60 degrees.    Currently in Pain?  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 (X73.532D)    Referring Provider (PT)  Erie Noe MD    Onset Date/Surgical Date  08/03/19    Hand Dominance  Right    Next MD Visit  08/29/19    Prior Therapy  yes      PROM   Right Knee Flexion  29       [facility] Adult PT Treatment/Exercise - 08/20/19 0001      Exercises   Exercises  Ankle      Knee/Hip Exercises: Supine   Quad Sets  Strengthening;Right;10 reps   10sec   Quad Sets Limitations  Long sitting    Heel Slides  Strengthening;AAROM;Right;10 reps   with strap; Assist from PTA on first 3 reps   Straight Leg Raises  Strengthening;Right;10 reps    Knee Extension  PROM;Right;1 set;10 reps    Knee Flexion  PROM;Right;10 reps      Knee/Hip Exercises: Sidelying   Hip ABduction   Strengthening;Right;10 reps    Hip ADduction  Strengthening;Right;10 reps      Knee/Hip Exercises: Prone   Hip Extension  Strengthening;Right;10 reps      Vasopneumatic   Number Minutes Vasopneumatic   10 minutes    Vasopnuematic Location   Knee   Rt   Vasopneumatic Pressure  Medium    Vasopneumatic Temperature   34 deg       Manual Therapy   Manual Therapy  Passive ROM    Passive ROM  --      Ankle Exercises: Stretches   Gastroc Stretch  2 reps;30 seconds      Ankle Exercises: Seated   Ankle Circles/Pumps  --      Ankle Exercises: Supine   T-Band  Rt PF with black band x 20 reps; DF, Inversion, eversion with green band x 20 reps   long sitting              PT Short Term Goals - 08/14/19 1543      PT SHORT TERM GOAL #1   Title  Initial rehab program addressing ROM and active movement per  protocol    Time  4    Period  Weeks    Status  On-going    Target Date  09/03/19      PT SHORT TERM GOAL #2   Title  ROM Rt knee 0 degrees extension to 90 degrees flexion    Time  4    Period  Weeks    Status  Partially Met    Target Date  09/03/19      PT SHORT TERM GOAL #3   Title  Patient able to tolerate 50% WB on RLE with gait    Baseline  -----    Time  6    Period  Weeks    Status  On-going    Target Date  09/17/19      PT SHORT TERM GOAL #4   Title  decreased inflammation in Rt knee to within 1 cm of left knee    Time  6    Period  Weeks    Status  On-going    Target Date  09/17/19      PT SHORT TERM GOAL #5   Title  -----------        PT Long Term Goals - 08/06/19 1347      PT LONG TERM GOAL #1   Title  I with advanced HEP    Time  12    Period  Weeks    Status  New    Target Date  10/29/19      PT LONG TERM GOAL #2   Title  increase Rt LE strength to 5/5    Time  12    Period  Weeks    Status  New      PT LONG TERM GOAL #3   Title  Improved right knee flexion to 100 deg    Time  12    Period  Weeks    Status  New    Target Date   10/29/19      PT LONG TERM GOAL #4   Title  Patient to demo no extensor lag with SLR    Time  12    Period  Weeks    Status  New    Target Date  10/29/19      PT LONG TERM GOAL #5   Title  Patient able to ambulate with a normal gait pattern and climb stairs without difficulty.    Time  12    Period  Weeks    Status  New    Target Date  10/29/19            Plan - 08/20/19 1614    Clinical Impression Statement  Pt able to perform Rt SLR independently this date; extensor lag present. Pt tolerated PROM to Rt LE with no pain; within protocol limits. Pt Rt LE PROM flexion has increased to 76 degrees; continued progress toward STG #2. Will continue to benefit from skilled PT for improved functional mobility.    Rehab Potential  Excellent    PT Frequency  2x / week    PT Duration  12 weeks    PT Treatment/Interventions  ADLs/Self Care Home Management;Cryotherapy;Occupational psychologist;Therapeutic exercise;Balance training;Neuromuscular re-education;Patient/family education;Manual techniques;Scar mobilization;Passive range of motion;Taping;Vasopneumatic Device    PT Next Visit Plan  See protocol for approved exercises through 4 weeks    PT Home Exercise Plan  ankle pumps, quad sets x 5 sec, calf stretch with towel, HS stretch with ice, hip ABD with  brace       Patient will benefit from skilled therapeutic intervention in order to improve the following deficits and impairments:  Impaired sensation, Decreased range of motion, Decreased strength, Decreased activity tolerance, Increased edema, Pain, Difficulty walking  Visit Diagnosis: Stiffness of right knee, not elsewhere classified  Acute pain of right knee  Muscle weakness (generalized)  Localized edema     Problem List Patient Active Problem List   Diagnosis Date Noted  . Left anterior cruciate ligament tear 09/08/2015  . ALLERGIC RHINITIS, SEASONAL 01/01/2011  . EPISTAXIS, RECURRENT  01/01/2011    Gardiner Rhyme, SPTA 08/20/2019, 4:21 PM   Kerin Perna, PTA 08/20/19 4:57 PM   [facility] [place], [place], [address] Phone: [phone]   Fax:  [phone]  Name: Ashley Fischer MRN: [id number] Date of Birth: [birth year]

## 2019-08-22 ENCOUNTER — Encounter: Payer: 59 | Admitting: Physical Therapy

## 2019-08-27 ENCOUNTER — Encounter: Payer: Self-pay | Admitting: Physical Therapy

## 2019-08-27 ENCOUNTER — Other Ambulatory Visit: Payer: Self-pay

## 2019-08-27 ENCOUNTER — Ambulatory Visit (INDEPENDENT_AMBULATORY_CARE_PROVIDER_SITE_OTHER): Payer: 59 | Admitting: Physical Therapy

## 2019-08-27 DIAGNOSIS — M6281 Muscle weakness (generalized): Secondary | ICD-10-CM

## 2019-08-27 DIAGNOSIS — M25661 Stiffness of right knee, not elsewhere classified: Secondary | ICD-10-CM | POA: Diagnosis not present

## 2019-08-27 DIAGNOSIS — R600 Localized edema: Secondary | ICD-10-CM | POA: Diagnosis not present

## 2019-08-27 DIAGNOSIS — M25561 Pain in right knee: Secondary | ICD-10-CM | POA: Diagnosis not present

## 2019-08-27 NOTE — Therapy (Signed)
[place], [place], [address] Phone: [phone]   Fax:  [phone]  Physical Therapy Treatment  Patient Details  Name: Ashley Fischer MRN: [id number] Date of Birth: 22-Oct-2000 Referring Provider (PT): Erie Noe MD   Encounter Date: 08/27/2019  PT End of Session - 08/27/19 0805    Visit Number  5    Number of Visits  24    Date for PT Re-Evaluation  10/29/19    PT Start Time  0800    PT Stop Time  0845    PT Time Calculation (min)  45 min    Activity Tolerance  Patient tolerated treatment well;No increased pain    Behavior During Therapy  [facility] for tasks assessed/performed       History reviewed. No pertinent past medical history.  Past Surgical History:  Procedure Laterality Date  . ARTHROSCOPIC REPAIR ACL      There were no vitals filed for this visit.  Subjective Assessment - 08/27/19 0804    Subjective  Pt states that she has gotten to 80 degrees in the CPM.    Currently in Pain?  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 (L89.211H)    Referring Provider (PT)  Erie Noe MD    Onset Date/Surgical Date  08/03/19    Hand Dominance  Right    Next MD Visit  08/29/19    Prior Therapy  yes      PROM   Right Knee Flexion  90          [facility] Adult PT Treatment/Exercise - 08/27/19 0001      Knee/Hip Exercises: Stretches   Passive Hamstring Stretch  Right;2 reps;20 seconds   supine with strap   Gastroc Stretch  Right;2 reps;20 seconds   longsitting with strap     Knee/Hip Exercises: Supine   Heel Slides  Strengthening;AAROM;Right;10 reps   with strap   Straight Leg Raises  Strengthening;Right;10 reps      Knee/Hip Exercises: Sidelying   Hip ABduction  Strengthening;Right;10 reps   cues for form   Hip ADduction  Strengthening;Right;10 reps      Knee/Hip  Exercises: Prone   Hip Extension  Strengthening;Right;20 reps   cues for form to target hamstrings and glutes     Vasopneumatic   Number Minutes Vasopneumatic   10 minutes    Vasopnuematic Location   Knee   Rt   Vasopneumatic Pressure  Medium    Vasopneumatic Temperature   34 deg       Manual Therapy   Passive ROM  PROM to RT LE in knee flex/ext       Ankle Exercises: Supine   T-Band  Rt PF with black band x 20 reps; DF, Inversion, eversion with green band x 20 reps   long sitting              PT Short Term Goals - 08/27/19 0855      PT SHORT TERM GOAL #1   Title  Initial rehab program addressing ROM and active movement per protocol    Time  4    Period  Weeks    Status  On-going    Target Date  09/03/19      PT SHORT TERM GOAL #2   Title  ROM Rt  knee 0 degrees extension to 90 degrees flexion    Time  4    Period  Weeks    Status  Achieved    Target Date  09/03/19      PT SHORT TERM GOAL #3   Title  Patient able to tolerate 50% WB on RLE with gait    Baseline  -----    Time  6    Period  Weeks    Status  On-going    Target Date  09/17/19      PT SHORT TERM GOAL #4   Title  decreased inflammation in Rt knee to within 1 cm of left knee    Time  6    Period  Weeks    Status  On-going    Target Date  09/17/19      PT SHORT TERM GOAL #5   Title  -----------        PT Long Term Goals - 08/06/19 1347      PT LONG TERM GOAL #1   Title  I with advanced HEP    Time  12    Period  Weeks    Status  New    Target Date  10/29/19      PT LONG TERM GOAL #2   Title  increase Rt LE strength to 5/5    Time  12    Period  Weeks    Status  New      PT LONG TERM GOAL #3   Title  Improved right knee flexion to 100 deg    Time  12    Period  Weeks    Status  New    Target Date  10/29/19      PT LONG TERM GOAL #4   Title  Patient to demo no extensor lag with SLR    Time  12    Period  Weeks    Status  New    Target Date  10/29/19      PT LONG TERM  GOAL #5   Title  Patient able to ambulate with a normal gait pattern and climb stairs without difficulty.    Time  12    Period  Weeks    Status  New    Target Date  10/29/19            Plan - 08/27/19 0836    Clinical Impression Statement  Pt tolerated PROM to Rt LE with no pain; within protocol limits. Pt required cues to improve form for hip abduction and hip extension. Pt continues to have extensor lag present in RT LE with SLR. Pt Rt LE AAROM flexion has increased to 90 degrees; met STG #2. Pt continues to make progress towward LTG # 4 and all other stated PT goals. She will continue to benefit from skilled PT intervention for improved functional mobility.    Rehab Potential  Excellent    PT Frequency  2x / week    PT Duration  12 weeks    PT Treatment/Interventions  ADLs/Self Care Home Management;Cryotherapy;Occupational psychologist;Therapeutic exercise;Balance training;Neuromuscular re-education;Patient/family education;Manual techniques;Scar mobilization;Passive range of motion;Taping;Vasopneumatic Device    PT Next Visit Plan  See protocol for approved exercises; [facility] next visit    PT Home Exercise Plan  ankle pumps, quad sets x 5 sec, calf stretch with towel, HS stretch with ice, hip ABD with brace       Patient will benefit from skilled therapeutic intervention in order  to improve the following deficits and impairments:  Impaired sensation, Decreased range of motion, Decreased strength, Decreased activity tolerance, Increased edema, Pain, Difficulty walking  Visit Diagnosis: Stiffness of right knee, not elsewhere classified  Acute pain of right knee  Muscle weakness (generalized)  Localized edema     Problem List Patient Active Problem List   Diagnosis Date Noted  . Left anterior cruciate ligament tear 09/08/2015  . ALLERGIC RHINITIS, SEASONAL 01/01/2011  . EPISTAXIS, RECURRENT 01/01/2011    Gardiner Rhyme, SPTA 08/27/2019, 9:04 AM    Kerin Perna, PTA 08/27/19 5:38 PM   [place], [place], [address] Phone: [phone]   Fax:  [phone]  Name: ALAYIAH FONTES MRN: [id number] Date of Birth: 04/08/2001

## 2019-08-29 ENCOUNTER — Encounter: Payer: 59 | Admitting: Physical Therapy

## 2019-08-30 ENCOUNTER — Ambulatory Visit (INDEPENDENT_AMBULATORY_CARE_PROVIDER_SITE_OTHER): Payer: 59 | Admitting: Physical Therapy

## 2019-08-30 ENCOUNTER — Encounter: Payer: Self-pay | Admitting: Physical Therapy

## 2019-08-30 ENCOUNTER — Other Ambulatory Visit: Payer: Self-pay

## 2019-08-30 DIAGNOSIS — M6281 Muscle weakness (generalized): Secondary | ICD-10-CM | POA: Diagnosis not present

## 2019-08-30 DIAGNOSIS — M25661 Stiffness of right knee, not elsewhere classified: Secondary | ICD-10-CM

## 2019-08-30 DIAGNOSIS — R600 Localized edema: Secondary | ICD-10-CM

## 2019-08-30 DIAGNOSIS — M25561 Pain in right knee: Secondary | ICD-10-CM

## 2019-08-30 NOTE — Therapy (Signed)
[place], [place], [address] Phone: [phone]   Fax:  [phone]  Physical Therapy Treatment  Patient Details  Name: Ashley Fischer MRN: [id number] Date of Birth: 07-Jul-2001 Referring Provider (PT): Erie Noe MD   Encounter Date: 08/30/2019  PT End of Session - 08/30/19 0810    Visit Number  6    Number of Visits  24    Date for PT Re-Evaluation  10/29/19    PT Start Time  0803    PT Stop Time  0848    PT Time Calculation (min)  45 min    Activity Tolerance  Patient tolerated treatment well;No increased pain    Behavior During Therapy  [facility] for tasks assessed/performed       History reviewed. No pertinent past medical history.  Past Surgical History:  Procedure Laterality Date  . ARTHROSCOPIC REPAIR ACL      There were no vitals filed for this visit.  Subjective Assessment - 08/30/19 0810    Subjective  Pt reports she visited with her surgeon; pleased with progress, wants her to work on good quad set and ROM up to 120 deg.  She finishes with her CPM on Saturday.    Patient Stated Goals  to get back to playing    Currently in Pain?  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    with 5 pulses in air    Straight Leg Raise with External Rotation  Strengthening;Right;1 set;5 reps    Other Supine Knee/Hip Exercises  toe touches (abdominal crunches) x 20, then alternating arm reach to toe touches (oblique crunches)       Knee/Hip Exercises: Sidelying   Hip ABduction  Strengthening;Right;10 reps    Hip ADduction  Strengthening;Right;1 set;20 reps   LLE on chair   Other Sidelying Knee/Hip Exercises  Lt leg hip abdct rainbows x 10 each leg, hip abdct pulses (5) x 10 reps LLE.       Knee/Hip Exercises: Prone   Hip Extension  Strengthening;Right;10 reps;20 reps   cues for form to target hamstrings and glutes   Other Prone Exercises  Rt TKE with Rt toes tucked x 10 sec x 10 reps       Vasopneumatic   Number Minutes Vasopneumatic   10 minutes    Vasopnuematic Location   Knee   Rt   Vasopneumatic Pressure  Medium    Vasopneumatic Temperature   34 deg  PT Short Term Goals - 08/30/19 1748      PT SHORT TERM GOAL #1   Title  Initial rehab program addressing ROM and active movement per protocol    Time  4    Period  Weeks    Status  On-going    Target Date  09/03/19      PT SHORT TERM GOAL #2   Title  ROM Rt knee 0 degrees extension to 90 degrees flexion    Time  4    Period  Weeks    Status  Achieved    Target Date  09/03/19      PT SHORT TERM GOAL #3   Title  Patient able to tolerate 50% WB on RLE with gait    Baseline  -----    Time  6    Period  Weeks    Status  On-going    Target Date  09/17/19      PT SHORT TERM GOAL #4   Title  decreased inflammation in Rt knee to within 1 cm of left knee    Time  6    Period  Weeks    Status  Achieved    Target Date  09/17/19      PT SHORT TERM GOAL #5   Title  -----------        PT Long Term Goals - 08/06/19 1347      PT LONG TERM GOAL #1   Title  I with advanced HEP    Time  12    Period  Weeks    Status  New    Target Date  10/29/19      PT LONG TERM GOAL #2   Title  increase Rt LE  strength to 5/5    Time  12    Period  Weeks    Status  New      PT LONG TERM GOAL #3   Title  Improved right knee flexion to 100 deg    Time  12    Period  Weeks    Status  New    Target Date  10/29/19      PT LONG TERM GOAL #4   Title  Patient to demo no extensor lag with SLR    Time  12    Period  Weeks    Status  New    Target Date  10/29/19      PT LONG TERM GOAL #5   Title  Patient able to ambulate with a normal gait pattern and climb stairs without difficulty.    Time  12    Period  Weeks    Status  New    Target Date  10/29/19            Plan - 08/30/19 0823    Clinical Impression Statement  Pt's Rt quad set and SLR improving, less extensor lag noted. Added variations on SLR exercises and beginning core to HEP (verbally added).  Pt has met STG#4 and is progressing well within the rehab protocol.     Rehab Potential  Excellent    PT Frequency  2x / week    PT Duration  12 weeks    PT Treatment/Interventions  ADLs/Self Care Home Management;Cryotherapy;Occupational psychologist;Therapeutic exercise;Balance training;Neuromuscular re-education;Patient/family education;Manual techniques;Scar mobilization;Passive range of motion;Taping;Vasopneumatic Device    PT Next Visit Plan  See protocol for approved exercises   PT Home Exercise Plan  Patient will benefit from skilled therapeutic intervention in order to improve the following deficits and impairments:  Impaired sensation, Decreased range of motion, Decreased strength, Decreased activity tolerance, Increased edema, Pain, Difficulty walking  Visit Diagnosis: Stiffness of right knee, not elsewhere classified  Acute pain of right knee  Muscle weakness (generalized)  Localized edema     Problem List Patient Active Problem List   Diagnosis Date Noted  . Left anterior cruciate ligament tear 09/08/2015  . ALLERGIC RHINITIS, SEASONAL 01/01/2011  . EPISTAXIS, RECURRENT  01/01/2011   Kerin Perna, PTA 08/30/19 5:48 PM  [place], [place], [address] Phone: [phone]   Fax:  [phone]  Name: Ashley Fischer MRN: [id number] Date of Birth: 11/08/2000

## 2019-09-03 ENCOUNTER — Other Ambulatory Visit: Payer: Self-pay

## 2019-09-03 ENCOUNTER — Ambulatory Visit (INDEPENDENT_AMBULATORY_CARE_PROVIDER_SITE_OTHER): Payer: 59 | Admitting: Physical Therapy

## 2019-09-03 DIAGNOSIS — R600 Localized edema: Secondary | ICD-10-CM

## 2019-09-03 DIAGNOSIS — M25661 Stiffness of right knee, not elsewhere classified: Secondary | ICD-10-CM | POA: Diagnosis not present

## 2019-09-03 DIAGNOSIS — M6281 Muscle weakness (generalized): Secondary | ICD-10-CM

## 2019-09-03 DIAGNOSIS — M25561 Pain in right knee: Secondary | ICD-10-CM | POA: Diagnosis not present

## 2019-09-03 DIAGNOSIS — R2689 Other abnormalities of gait and mobility: Secondary | ICD-10-CM

## 2019-09-03 NOTE — Therapy (Signed)
[place], [place], [address] Phone: [phone]   Fax:  [phone]  Physical Therapy Treatment  Patient Details  Name: Ashley Fischer MRN: [id number] Date of Birth: 12-03-2000 Referring Provider (PT): Erie Noe MD   Encounter Date: 09/03/2019  PT End of Session - 09/03/19 0852    Visit Number  7    Number of Visits  24    Date for PT Re-Evaluation  10/29/19    PT Start Time  0846    PT Stop Time  0930    PT Time Calculation (min)  44 min    Activity Tolerance  Patient tolerated treatment well;No increased pain    Behavior During Therapy  [facility] for tasks assessed/performed       No past medical history on file.  Past Surgical History:  Procedure Laterality Date  . ARTHROSCOPIC REPAIR ACL      There were no vitals filed for this visit.  Subjective Assessment - 09/03/19 0853    Subjective  Pt reports no new changes since last visit.  She is excited to start 50% WB this week.    Currently in Pain?  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)    Referring Provider (PT)  Erie Noe MD    Onset Date/Surgical Date  08/03/19    Hand Dominance  Right    Next MD Visit  09/26/19    Prior Therapy  yes      PROM   Right/Left Knee  Right    Right Knee Flexion  100   AAROM on bike       [facility] Adult PT Treatment/Exercise - 09/03/19 0001      Ambulation/Gait   Ambulation Distance (Feet)  50 Feet    Assistive device  Crutches    Gait Pattern  Step-through pattern   50% WB   Ambulation Surface  Level;Indoor    Pre-Gait Activities  Weight shifts onto Rt foot (on scale, with bilat axillary crutches, monitoring maintenance of 50%WB) x 5 reps, then weight shifts forward backward with step thru of LLE x 15 reps     Gait Comments  cues to  maintain partial WB) and for heel strike.       Knee/Hip Exercises: Stretches   Contractor reps;20 seconds   longsitting with strap     Knee/Hip Exercises: Aerobic   Recumbent Bike  partial revolutions for ROM for 5 min      Knee/Hip Exercises: Standing   Other Standing Knee Exercises  --      Knee/Hip Exercises: Supine   Straight Leg Raises  Strengthening;Right;1 set;15 reps   long sitting   Other Supine Knee/Hip Exercises  toe touches (abdominal crunches) x 40, then alternating arm reach to toe touches (oblique crunches) x 10; bilat leg metrinome with arms out to side (core engagemt) x 10      Knee/Hip Exercises: Sidelying   Other Sidelying Knee/Hip Exercises  Lt leg hip abdct rainbows x 10 RLE.       Knee/Hip Exercises: Prone   Other Prone Exercises  Rt TKE with Rt toes tucked x 10 sec x 10 reps     Other Prone Exercises  High plank with minimal wt through  RLE x 10 sec x 3 reps, then trial of 1 pushup.       Vasopneumatic   Number Minutes Vasopneumatic   10 minutes    Vasopnuematic Location   Knee   Rt   Vasopneumatic Pressure  Medium    Vasopneumatic Temperature   34 deg                PT Short Term Goals - 08/30/19 1748      PT SHORT TERM GOAL #1   Title  Initial rehab program addressing ROM and active movement per protocol    Time  4    Period  Weeks    Status  On-going    Target Date  09/03/19      PT SHORT TERM GOAL #2   Title  ROM Rt knee 0 degrees extension to 90 degrees flexion    Time  4    Period  Weeks    Status  Achieved    Target Date  09/03/19      PT SHORT TERM GOAL #3   Title  Patient able to tolerate 50% WB on RLE with gait    Baseline  -----    Time  6    Period  Weeks    Status  Achieved   Target Date  09/17/19      PT SHORT TERM GOAL #4   Title  decreased inflammation in Rt knee to within 1 cm of left knee    Time  6    Period  Weeks    Status  Achieved    Target Date  09/17/19      PT SHORT TERM GOAL #5    Title  -----------        PT Long Term Goals - 08/06/19 1347      PT LONG TERM GOAL #1   Title  I with advanced HEP    Time  12    Period  Weeks    Status  New    Target Date  10/29/19      PT LONG TERM GOAL #2   Title  increase Rt LE strength to 5/5    Time  12    Period  Weeks    Status  New      PT LONG TERM GOAL #3   Title  Improved right knee flexion to 100 deg    Time  12    Period  Weeks    Status  New    Target Date  10/29/19      PT LONG TERM GOAL #4   Title  Patient to demo no extensor lag with SLR    Time  12    Period  Weeks    Status  New    Target Date  10/29/19      PT LONG TERM GOAL #5   Title  Patient able to ambulate with a normal gait pattern and climb stairs without difficulty.    Time  12    Period  Weeks    Status  New    Target Date  10/29/19            Plan - 09/03/19 0920    Clinical Impression Statement  Pt able to tolerate 50% weight bearing into RLE without difficulty or pain (monitored with scale).  Rt knee flexion ROM improved to 100 deg.  She tolerated all exercises without any symptoms.  Progressing towards goals. Has met STG #3.  Rehab Potential  Excellent    PT Frequency  2x / week    PT Duration  12 weeks    PT Treatment/Interventions  ADLs/Self Care Home Management;Cryotherapy;Occupational psychologist;Therapeutic exercise;Balance training;Neuromuscular re-education;Patient/family education;Manual techniques;Scar mobilization;Passive range of motion;Taping;Vasopneumatic Device    PT Next Visit Plan  See protocol for approved exercises through 8 wks. 50% WB wks 4-6.    PT Home Exercise Plan  ankle pumps, quad sets x 5 sec, calf stretch with towel, HS stretch with ice, hip ABD with brace       Patient will benefit from skilled therapeutic intervention in order to improve the following deficits and impairments:  Impaired sensation, Decreased range of motion, Decreased strength, Decreased activity  tolerance, Increased edema, Pain, Difficulty walking  Visit Diagnosis: Stiffness of right knee, not elsewhere classified  Acute pain of right knee  Muscle weakness (generalized)  Localized edema  Other abnormalities of gait and mobility     Problem List Patient Active Problem List   Diagnosis Date Noted  . Left anterior cruciate ligament tear 09/08/2015  . ALLERGIC RHINITIS, SEASONAL 01/01/2011  . EPISTAXIS, RECURRENT 01/01/2011   Kerin Perna, PTA 09/03/19 9:26 AM  [facility] [place], [place], [address] Phone: [phone]   Fax:  [phone]  Name: CHARNE MCBRIEN MRN: [id number] Date of Birth: 03-06-2001

## 2019-09-05 ENCOUNTER — Ambulatory Visit (INDEPENDENT_AMBULATORY_CARE_PROVIDER_SITE_OTHER): Payer: 59 | Admitting: Physical Therapy

## 2019-09-05 ENCOUNTER — Encounter: Payer: Self-pay | Admitting: Physical Therapy

## 2019-09-05 ENCOUNTER — Other Ambulatory Visit: Payer: Self-pay

## 2019-09-05 DIAGNOSIS — M6281 Muscle weakness (generalized): Secondary | ICD-10-CM | POA: Diagnosis not present

## 2019-09-05 DIAGNOSIS — M25561 Pain in right knee: Secondary | ICD-10-CM

## 2019-09-05 DIAGNOSIS — R600 Localized edema: Secondary | ICD-10-CM | POA: Diagnosis not present

## 2019-09-05 DIAGNOSIS — M25661 Stiffness of right knee, not elsewhere classified: Secondary | ICD-10-CM | POA: Diagnosis not present

## 2019-09-05 NOTE — Therapy (Signed)
[facility] [place] [place] [address] [place], [place], [address] Phone: [phone]   Fax:  [phone]  Physical Therapy Treatment  Patient Details  Name: Ashley Fischer MRN: [id number] Date of Birth: [birth year] Referring Provider (PT): Reed Breech MD   Encounter Date: 09/05/2019  PT End of Session - 09/05/19 0854    Visit Number  8    Number of Visits  24    Date for PT Re-Evaluation  10/29/19    PT Start Time  0849    PT Stop Time  0935    PT Time Calculation (min)  46 min    Activity Tolerance  Patient tolerated treatment well;No increased pain    Behavior During Therapy   [facility] for tasks assessed/performed       History reviewed. No pertinent past medical history.  Past Surgical History:  Procedure Laterality Date  . ARTHROSCOPIC REPAIR ACL      There were no vitals filed for this visit.  Subjective Assessment - 09/05/19 1015    Subjective  Pt reports no new changes.    Patient Stated Goals  to get back to playing basketball    Currently in Pain?  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 (Y50.354S)    Referring Provider (PT)  Reed Breech MD    Onset Date/Surgical Date  08/03/19    Hand Dominance  Right    Next MD Visit  09/26/19    Prior Therapy  yes      Restrictions   Weight Bearing Restrictions  Yes    RLE Weight Bearing  Partial weight bearing    RLE Partial Weight Bearing Percentage or Pounds  up to 50%   with bilat axillary crutches     PROM   Right/Left Knee  Right    Right Knee Flexion  101   AAROM on bike     [facility] Adult PT Treatment/Exercise - 09/05/19 0001      Knee/Hip Exercises: Aerobic   Recumbent Bike  partial revolutions for ROM for 5 min    Other Aerobic  UBE (seated): 2 min forward, 2 min backward L2-5.       Knee/Hip Exercises: Standing    Other Standing Knee Exercises  countertop push ups with RLE (NWB in hip ext) x 10      Knee/Hip Exercises: Supine   Quad Sets  Strengthening;Right;1 set;10 reps   long sitting   Straight Leg Raises  Right;1 set;10 reps   long sitting    Straight Leg Raises Limitations  slight lag noted    Other Supine Knee/Hip Exercises  hips flex 90 deg and elbow touch to each knee x 10     Other Supine Knee/Hip Exercises  hamstring sets x 5 sec x 10 (knee flex on 40 deg)      Knee/Hip Exercises: Sidelying   Hip ADduction  Strengthening;Right;1 set;20 reps   LLE on chair   Other Sidelying Knee/Hip Exercises  Rt leg hip abdct with 5 pulses x 10 reps      Knee/Hip Exercises: Prone   Prone Knee Hang  1 minute   trial   Other Prone Exercises  Rt TKE with Rt toes tucked, glute set x 10 sec x 5reps     Other Prone  Exercises  High plank with opp shoulder touches and Rt TKE x 10      Vasopneumatic   Number Minutes Vasopneumatic   10 minutes    Vasopnuematic Location   Knee   Rt   Vasopneumatic Pressure  Medium    Vasopneumatic Temperature   34 deg       Self care:  Pt instructed in self patellar mobs; pt returned demo with cues.     PT Short Term Goals - 08/30/19 1748      PT SHORT TERM GOAL #1   Title  Initial rehab program addressing ROM and active movement per protocol    Time  4    Period  Weeks    Status  On-going    Target Date  09/03/19      PT SHORT TERM GOAL #2   Title  ROM Rt knee 0 degrees extension to 90 degrees flexion    Time  4    Period  Weeks    Status  Achieved    Target Date  09/03/19      PT SHORT TERM GOAL #3   Title  Patient able to tolerate 50% WB on RLE with gait    Baseline  -----    Time  6    Period  Weeks    Status Achieved   Target Date  09/17/19      PT SHORT TERM GOAL #4   Title  decreased inflammation in Rt knee to within 1 cm of left knee    Time  6    Period  Weeks    Status  Achieved    Target Date  09/17/19      PT SHORT TERM GOAL #5    Title  -----------        PT Long Term Goals - 08/06/19 1347      PT LONG TERM GOAL #1   Title  I with advanced HEP    Time  12    Period  Weeks    Status  New    Target Date  10/29/19      PT LONG TERM GOAL #2   Title  increase Rt LE strength to 5/5    Time  12    Period  Weeks    Status  New      PT LONG TERM GOAL #3   Title  Improved right knee flexion to 100 deg    Time  12    Period  Weeks    Status  New    Target Date  10/29/19      PT LONG TERM GOAL #4   Title  Patient to demo no extensor lag with SLR    Time  12    Period  Weeks    Status  New    Target Date  10/29/19      PT LONG TERM GOAL #5   Title  Patient able to ambulate with a normal gait pattern and climb stairs without difficulty.    Time  12    Period  Weeks    Status  New    Target Date  10/29/19            Plan - 09/05/19 1016    Clinical Impression Statement  Pt continues to have slight extensor lag with SLR, despite good quad contraction.  Added UE and core to HEP.  All exercises were tolerated well. Progressing well within rehab protocol.  She has  achieved STG #3.    Rehab Potential  Excellent    PT Frequency  2x / week    PT Duration  12 weeks    PT Treatment/Interventions  ADLs/Self Care Home Management;Cryotherapy;Retail bankerlectrical Stimulation;Gait training;Stair training;Therapeutic exercise;Balance training;Neuromuscular re-education;Patient/family education;Manual techniques;Scar mobilization;Passive range of motion;Taping;Vasopneumatic Device    PT Next Visit Plan  See protocol for approved exercises through 8 wks. 50% WB wks 4-6.    PT Home Exercise Plan  ankle pumps, quad sets x 5 sec, calf stretch with towel, HS stretch with ice, hip ABD with brace       Patient will benefit from skilled therapeutic intervention in order to improve the following deficits and impairments:  Impaired sensation, Decreased range of motion, Decreased strength, Decreased activity tolerance, Increased  edema, Pain, Difficulty walking  Visit Diagnosis: Stiffness of right knee, not elsewhere classified  Muscle weakness (generalized)  Localized edema  Acute pain of right knee     Problem List Patient Active Problem List   Diagnosis Date Noted  . Left anterior cruciate ligament tear 09/08/2015  . ALLERGIC RHINITIS, SEASONAL 01/01/2011  . EPISTAXIS, RECURRENT 01/01/2011   Mayer CamelJennifer Carlson-Long, PTA 09/05/19 11:08 AM  [facility] [place] [address] [place], [place], [address] Phone: [phone]   Fax:  [phone]  Name: Ashley Fischer MRN: [id number] Date of Birth: 08/09/2001

## 2019-09-10 ENCOUNTER — Ambulatory Visit (INDEPENDENT_AMBULATORY_CARE_PROVIDER_SITE_OTHER): Payer: 59 | Admitting: Physical Therapy

## 2019-09-10 ENCOUNTER — Encounter: Payer: Self-pay | Admitting: Physical Therapy

## 2019-09-10 ENCOUNTER — Other Ambulatory Visit: Payer: Self-pay

## 2019-09-10 DIAGNOSIS — M25661 Stiffness of right knee, not elsewhere classified: Secondary | ICD-10-CM | POA: Diagnosis not present

## 2019-09-10 DIAGNOSIS — M6281 Muscle weakness (generalized): Secondary | ICD-10-CM

## 2019-09-10 DIAGNOSIS — R600 Localized edema: Secondary | ICD-10-CM

## 2019-09-10 DIAGNOSIS — M25561 Pain in right knee: Secondary | ICD-10-CM

## 2019-09-10 NOTE — Therapy (Signed)
[place], [place], [address] Phone: [phone]   Fax:  [phone]  Physical Therapy Treatment  Patient Details  Name: Ashley Fischer MRN: [id number] Date of Birth: 2001-01-17 Referring Provider (PT): Erie Noe MD   Encounter Date: 09/10/2019  PT End of Session - 09/10/19 1615    Visit Number  9    Number of Visits  24    Date for PT Re-Evaluation  10/29/19    PT Start Time  2229    PT Stop Time  1600    PT Time Calculation (min)  43 min    Activity Tolerance  Patient tolerated treatment well;No increased pain    Behavior During Therapy  [facility] for tasks assessed/performed       History reviewed. No pertinent past medical history.  Past Surgical History:  Procedure Laterality Date  . ARTHROSCOPIC REPAIR ACL      There were no vitals filed for this visit.  Subjective Assessment - 09/10/19 1616    Subjective  Pt reports she is now able complete a good set, equal to other leg. Continues to ambulate with bilat axillary crutches with knee brace locked in extension.    Patient Stated Goals  to get back to playing basketball    Currently in Pain?  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 (N98.921J)    Referring Provider (PT)  Erie Noe MD    Onset Date/Surgical Date  08/03/19    Hand Dominance  Right    Next MD Visit  09/26/19    Prior Therapy  yes      Restrictions   Weight Bearing Restrictions  Yes    RLE Weight Bearing  Partial weight bearing    RLE Partial Weight Bearing Percentage or Pounds  up to 50%   with bilat axillary crutches     PROM   Right/Left Knee  Right    Right Knee Flexion  110   ROM when on bicycle      [facility] Adult PT Treatment/Exercise - 09/10/19 0001      Knee/Hip Exercises: Stretches   Gastroc Stretch  Both;2 reps;30 seconds   incline board with off loading into crutches.      Knee/Hip Exercises: Aerobic   Recumbent Bike  partial to slow revolutions for ROM for 5 min      Knee/Hip Exercises: Standing   Other Standing Knee Exercises  weight shifts onto scale to verify maintenance of PWB x 5 reps with bilat axillar crutches (up to 43% WB)    Other Standing Knee Exercises  Rt quad set in standing x 10 sec x 10 reps       Knee/Hip Exercises: Supine   Quad Sets  Right;1 set;5 reps   5-8 sec holds   Quad Sets Limitations  Long sitting     Straight Leg Raises  Right;1 set;10 reps   long sitting    Other Supine Knee/Hip Exercises  hips flex 90 deg, knee ext and toe touch to crossing over to each foot x 60 total.      Knee/Hip Exercises: Sidelying   Hip ABduction  Strengthening;Right;10 reps    Hip ADduction  Strengthening;Right;1 set;20 reps   LLE on chair   Other Sidelying Knee/Hip Exercises  Rt leg hip abdct rainbows x 10 each leg, hip abdct pulses (5) x 10 reps RLE    Other Sidelying Knee/Hip Exercises  Lt side plank x 10 sec x 2 reps      Knee/Hip Exercises: Prone   Prone Knee Hang  --   reviewed orally.    Other Prone Exercises  Opp arm/leg x 10 each side; stiff leg flutter (gentle) with swim arms x 10 sec x 2 reps     Other Prone Exercises  High plank with focus on Rt knee quad set x 15 sec; repeated for 30 sec       Vasopneumatic   Number Minutes Vasopneumatic   10 minutes    Vasopnuematic Location   Knee   Rt   Vasopneumatic Pressure  Medium    Vasopneumatic Temperature   34 deg                PT Short Term Goals - 09/05/19 1109      PT SHORT TERM GOAL #1   Title  Initial rehab program addressing ROM and active movement per protocol    Time  4    Period  Weeks    Status  On-going    Target Date  09/03/19      PT SHORT TERM GOAL #2   Title  ROM Rt knee 0 degrees extension to 90 degrees flexion    Time  4     Period  Weeks    Status  Achieved    Target Date  09/03/19      PT SHORT TERM GOAL #3   Title  Patient able to tolerate 50% WB on RLE with gait    Baseline  -----    Time  6    Period  Weeks    Status  Achieved    Target Date  09/17/19      PT SHORT TERM GOAL #4   Title  decreased inflammation in Rt knee to within 1 cm of left knee    Time  6    Period  Weeks    Status  Achieved    Target Date  09/17/19      PT SHORT TERM GOAL #5   Title  -----------        PT Long Term Goals - 09/10/19 1628      PT LONG TERM GOAL #1   Title  I with advanced HEP    Time  12    Period  Weeks    Status  On-going      PT LONG TERM GOAL #2   Title  increase Rt LE strength to 5/5    Time  12    Period  Weeks    Status  On-going      PT LONG TERM GOAL #3   Title  Improved right knee flexion to 100 deg    Time  12    Period  Weeks    Status  Achieved      PT LONG TERM GOAL #4   Title  Patient to demo no extensor lag with SLR    Time  12    Period  Weeks    Status  On-going      PT LONG TERM GOAL #5   Title  Patient able to ambulate with a normal gait pattern and climb stairs without difficulty.  Time  12    Period  Weeks    Status  On-going            Plan - 09/10/19 1624    Clinical Impression Statement  Pt able to complete strong quad set and SLR without extensor lag today.  Pt demo improved Rt knee flexion ROM; has met LTG #3. Tolerated all exercises without any symptoms.  Progressing within rehab protocol.    Rehab Potential  Excellent    PT Frequency  2x / week    PT Duration  12 weeks    PT Treatment/Interventions  ADLs/Self Care Home Management;Cryotherapy;Occupational psychologist;Therapeutic exercise;Balance training;Neuromuscular re-education;Patient/family education;Manual techniques;Scar mobilization;Passive range of motion;Taping;Vasopneumatic Device    PT Next Visit Plan  See protocol for approved exercises through 8 wks. 50% WB  wks 4-6.    PT Home Exercise Plan  ankle pumps, quad sets x 5 sec, calf stretch with towel, HS stretch with ice, hip ABD with brace       Patient will benefit from skilled therapeutic intervention in order to improve the following deficits and impairments:  Impaired sensation, Decreased range of motion, Decreased strength, Decreased activity tolerance, Increased edema, Pain, Difficulty walking  Visit Diagnosis: Stiffness of right knee, not elsewhere classified  Muscle weakness (generalized)  Localized edema  Acute pain of right knee     Problem List Patient Active Problem List   Diagnosis Date Noted  . Left anterior cruciate ligament tear 09/08/2015  . ALLERGIC RHINITIS, SEASONAL 01/01/2011  . EPISTAXIS, RECURRENT 01/01/2011   Kerin Perna, PTA 09/10/19 4:29 PM  [place], [place], [address] Phone: [phone]   Fax:  [phone]  Name: SHERITA DECOSTE MRN: [id number] Date of Birth: [birth year]

## 2019-09-12 ENCOUNTER — Ambulatory Visit (INDEPENDENT_AMBULATORY_CARE_PROVIDER_SITE_OTHER): Payer: 59 | Admitting: Physical Therapy

## 2019-09-12 ENCOUNTER — Other Ambulatory Visit: Payer: Self-pay

## 2019-09-12 DIAGNOSIS — R600 Localized edema: Secondary | ICD-10-CM | POA: Diagnosis not present

## 2019-09-12 DIAGNOSIS — M25561 Pain in right knee: Secondary | ICD-10-CM

## 2019-09-12 DIAGNOSIS — M6281 Muscle weakness (generalized): Secondary | ICD-10-CM

## 2019-09-12 DIAGNOSIS — M25661 Stiffness of right knee, not elsewhere classified: Secondary | ICD-10-CM

## 2019-09-12 NOTE — Therapy (Signed)
[facility] [place] [address] [place], [place], [address] Phone: [phone]   Fax:  [phone]  Physical Therapy Treatment  Patient Details  Name: Ashley Fischer MRN: [id number] Date of Birth: 12/17/2000 Referring Provider (PT): Reed BreechAlison Toth MD   Encounter Date: 09/12/2019  PT End of Session - 09/12/19 1112    Visit Number  10    Number of Visits  24    Date for PT Re-Evaluation  10/29/19    PT Start Time  1015    PT Stop Time  1104    PT Time Calculation (min)  49 min    Activity Tolerance  Patient tolerated treatment well;No increased pain    Behavior During Therapy  [facility] for tasks assessed/performed       No past medical history on file.  Past Surgical History:  Procedure Laterality Date  . ARTHROSCOPIC REPAIR ACL      There were no vitals filed for this visit.      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 (O96.295M(S72.491K)    Referring Provider (PT)  Reed BreechAlison Toth MD    Onset Date/Surgical Date  08/03/19    Hand Dominance  Right    Next MD Visit  09/26/19    Prior Therapy  yes      Restrictions   Weight Bearing Restrictions  Yes    RLE Weight Bearing  Partial weight bearing    RLE Partial Weight Bearing Percentage or Pounds  up to 50%   with bilat axillary crutches     [facility] Adult PT Treatment/Exercise - 09/12/19 0001      Knee/Hip Exercises: Stretches   Passive Hamstring Stretch  Right;2 reps;30 seconds   supine with strap   Gastroc Stretch  Right;2 reps;30 seconds   supine with strap     Knee/Hip Exercises: Aerobic   Recumbent Bike  partial to slow revolutions for ROM for 5 min    Other Aerobic  UBE (seated): 2 min forward, 2 min backward L5.       Knee/Hip Exercises: Supine   Quad Sets  Strengthening;Right;1 set;5 reps   10 sec hold   Quad Sets Limitations  long sitting     Heel Slides   AAROM;Right;10 reps   with strap   Straight Leg Raise with External Rotation  Strengthening;2 sets;5 reps   5 sec eccentric return to neutral.    Other Supine Knee/Hip Exercises  hips flex 90 deg, knees straight, small range metronome x 10 reps L/R (core engaged), then scissor legs (knees straight) with small range in hip flexion for core work.      Knee/Hip Exercises: Prone   Hamstring Curl  --   3 reps AAROM with use of LLE    Prone Knee Hang  1 minute   2 reps    2 squid shaped pieces of KT applied to ant Rt knee (avoiding steri strips) to assist with edema reduction. (tape provided by pt)  Vasopneumatic (cold compression) - x 10 min, 34 deg, medium pressure for edema reduction.     PT Short Term Goals - 09/05/19 1109      PT SHORT TERM GOAL #1   Title  Initial rehab program addressing ROM and active movement per protocol    Time  4    Period  Weeks    Status  On-going  Target Date  09/03/19      PT SHORT TERM GOAL #2   Title  ROM Rt knee 0 degrees extension to 90 degrees flexion    Time  4    Period  Weeks    Status  Achieved    Target Date  09/03/19      PT SHORT TERM GOAL #3   Title  Patient able to tolerate 50% WB on RLE with gait    Baseline  -----    Time  6    Period  Weeks    Status  Achieved    Target Date  09/17/19      PT SHORT TERM GOAL #4   Title  decreased inflammation in Rt knee to within 1 cm of left knee    Time  6    Period  Weeks    Status  Achieved    Target Date  09/17/19      PT SHORT TERM GOAL #5   Title  -----------        PT Long Term Goals - 09/10/19 1628      PT LONG TERM GOAL #1   Title  I with advanced HEP    Time  12    Period  Weeks    Status  On-going      PT LONG TERM GOAL #2   Title  increase Rt LE strength to 5/5    Time  12    Period  Weeks    Status  On-going      PT LONG TERM GOAL #3   Title  Improved right knee flexion to 100 deg    Time  12    Period  Weeks    Status  Achieved      PT LONG TERM  GOAL #4   Title  Patient to demo no extensor lag with SLR    Time  12    Period  Weeks    Status  On-going      PT LONG TERM GOAL #5   Title  Patient able to ambulate with a normal gait pattern and climb stairs without difficulty.    Time  12    Period  Weeks    Status  On-going            Plan - 09/12/19 1042    Clinical Impression Statement  Rt quad set remains strong and SLR without extensor lag; fatigues after 10 reps with SLR and after 5 reps with SLR with ER.  Including core and UE conditioning into session. Rt knee flexion AROM now 0-110 deg.  She continues to progress well within rehab protocol.    Rehab Potential  Excellent    PT Frequency  2x / week    PT Duration  12 weeks    PT Treatment/Interventions  ADLs/Self Care Home Management;Cryotherapy;Retail banker;Therapeutic exercise;Balance training;Neuromuscular re-education;Patient/family education;Manual techniques;Scar mobilization;Passive range of motion;Taping;Vasopneumatic Device    PT Next Visit Plan  See protocol for approved exercises through 8 wks. 50% WB wks 4-6.    PT Home Exercise Plan  ankle pumps, quad sets x 5 sec, calf stretch with towel, HS stretch with ice, hip ABD with brace       Patient will benefit from skilled therapeutic intervention in order to improve the following deficits and impairments:  Impaired sensation, Decreased range of motion, Decreased strength, Decreased activity tolerance, Increased edema, Pain, Difficulty walking  Visit Diagnosis: Stiffness of right knee, not elsewhere  classified  Muscle weakness (generalized)  Localized edema  Acute pain of right knee     Problem List Patient Active Problem List   Diagnosis Date Noted  . Left anterior cruciate ligament tear 09/08/2015  . ALLERGIC RHINITIS, SEASONAL 01/01/2011  . EPISTAXIS, RECURRENT 01/01/2011   Kerin Perna, PTA 09/12/19 1:26 PM  [facility] [place], [place], [address] Phone: [phone]   Fax:  [phone]  Name: Ashley Fischer MRN: [id number] Date of Birth: 19-Jun-2001

## 2019-09-17 ENCOUNTER — Encounter: Payer: Self-pay | Admitting: Physical Therapy

## 2019-09-17 ENCOUNTER — Other Ambulatory Visit: Payer: Self-pay

## 2019-09-17 ENCOUNTER — Ambulatory Visit (INDEPENDENT_AMBULATORY_CARE_PROVIDER_SITE_OTHER): Payer: 59 | Admitting: Physical Therapy

## 2019-09-17 DIAGNOSIS — M25661 Stiffness of right knee, not elsewhere classified: Secondary | ICD-10-CM

## 2019-09-17 DIAGNOSIS — R2689 Other abnormalities of gait and mobility: Secondary | ICD-10-CM | POA: Diagnosis not present

## 2019-09-17 DIAGNOSIS — R600 Localized edema: Secondary | ICD-10-CM | POA: Diagnosis not present

## 2019-09-17 DIAGNOSIS — M6281 Muscle weakness (generalized): Secondary | ICD-10-CM

## 2019-09-17 NOTE — Therapy (Signed)
[place], [place], [address] Phone: [phone]   Fax:  [phone]  Physical Therapy Treatment  Patient Details  Name: Ashley Fischer MRN: [id number] Date of Birth: [birth year] Referring Provider (PT): Erie Noe MD   Encounter Date: 09/17/2019  PT End of Session - 09/17/19 1444    Visit Number  11    Number of Visits  24    Date for PT Re-Evaluation  10/29/19    PT Start Time  1427    PT Stop Time  1516    PT Time Calculation (min)  49 min    Activity Tolerance  Patient tolerated treatment well;No increased pain    Behavior During Therapy  [facility] for tasks assessed/performed       History reviewed. No pertinent past medical history.  Past Surgical History:  Procedure Laterality Date  . ARTHROSCOPIC REPAIR ACL      There were no vitals filed for this visit.  Subjective Assessment - 09/17/19 1504    Subjective  Pt reports she has noticed some clicking in the lateral part of Rt knee with heel slides.  Otherwise, no new changes.    Patient Stated Goals  to get back to playing basketball    Currently in Pain?  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 (G38.756E)    Referring Provider (PT)  Erie Noe MD    Onset Date/Surgical Date  08/03/19    Hand Dominance  Right    Next MD Visit  09/26/19    Prior Therapy  yes      Restrictions   RLE Partial Weight Bearing Percentage or Pounds  WBAT       PROM   Right/Left Knee  Right    Right Knee Extension  0    Right Knee Flexion  115       [facility] Adult PT Treatment/Exercise - 09/17/19 0001      Knee/Hip Exercises: Stretches   Passive Hamstring Stretch  Right;2 reps;30 seconds   supine with strap   ITB Stretch  Right;2 reps;20 seconds    Gastroc Stretch  Both;2 reps;20 seconds      Knee/Hip  Exercises: Aerobic   Recumbent Bike  slow full revolutions (no resistance) on bike for Rt knee ROM. x 5 min     Other Aerobic  UBE (seated): 2.5 min forward, 2.5 min backward L7      Knee/Hip Exercises: Supine   Heel Slides  AAROM;Right;10 reps   with strap   Straight Leg Raises  Right;1 set;10 reps   on elbows   Straight Leg Raises Limitations  3 reps in long sitting, fatigued.       Knee/Hip Exercises: Sidelying   Hip ADduction  Strengthening;Right;Left;1 set;20 reps   top leg on chair      Knee/Hip Exercises: Prone   Other Prone Exercises  High plank with focus on Rt knee quad set with 10 shoulder taps.       Vasopneumatic   Number Minutes Vasopneumatic   10 minutes    Vasopnuematic Location   Knee   Rt   Vasopneumatic Pressure  Medium    Vasopneumatic Temperature   34 deg  PT Short Term Goals - 09/05/19 1109      PT SHORT TERM GOAL #1   Title  Initial rehab program addressing ROM and active movement per protocol    Time  4    Period  Weeks    Status  On-going    Target Date  09/03/19      PT SHORT TERM GOAL #2   Title  ROM Rt knee 0 degrees extension to 90 degrees flexion    Time  4    Period  Weeks    Status  Achieved    Target Date  09/03/19      PT SHORT TERM GOAL #3   Title  Patient able to tolerate 50% WB on RLE with gait    Baseline  -----    Time  6    Period  Weeks    Status  Achieved    Target Date  09/17/19      PT SHORT TERM GOAL #4   Title  decreased inflammation in Rt knee to within 1 cm of left knee    Time  6    Period  Weeks    Status  Achieved    Target Date  09/17/19      PT SHORT TERM GOAL #5   Title  -----------        PT Long Term Goals - 09/17/19 1723      PT LONG TERM GOAL #1   Title  I with advanced HEP    Time  12    Period  Weeks    Status  On-going      PT LONG TERM GOAL #2   Title  increase Rt LE strength to 5/5    Time  12    Period  Weeks    Status  On-going      PT LONG TERM GOAL #3    Title  Improved right knee flexion to 100 deg    Time  12    Period  Weeks    Status  Achieved      PT LONG TERM GOAL #4   Title  Patient to demo no extensor lag with SLR    Time  12    Period  Weeks    Status  Achieved      PT LONG TERM GOAL #5   Title  Patient able to ambulate with a normal gait pattern and climb stairs without difficulty.    Time  12    Period  Weeks    Status  On-going            Plan - 09/17/19 1718    Clinical Impression Statement  Steady improvement in Rt knee flexion ROM.  Pt continues to maintain good quad contraction on Rt.  SLR without extensor lag, however fatigues quickly with long sitting positioning.  Has met LTG#4. Progressing well within rehab protocol.    Rehab Potential  Excellent    PT Frequency  2x / week    PT Duration  12 weeks    PT Treatment/Interventions  ADLs/Self Care Home Management;Cryotherapy;Occupational psychologist;Therapeutic exercise;Balance training;Neuromuscular re-education;Patient/family education;Manual techniques;Scar mobilization;Passive range of motion;Taping;Vasopneumatic Device    PT Next Visit Plan  See protocol for approved exercises through 8 wks. WBAT week 6-8 with crutches and brace in ext.       Patient will benefit from skilled therapeutic intervention in order to improve the following deficits and impairments:  Impaired sensation, Decreased range of  motion, Decreased strength, Decreased activity tolerance, Increased edema, Pain, Difficulty walking  Visit Diagnosis: Stiffness of right knee, not elsewhere classified  Muscle weakness (generalized)  Localized edema  Other abnormalities of gait and mobility     Problem List Patient Active Problem List   Diagnosis Date Noted  . Left anterior cruciate ligament tear 09/08/2015  . ALLERGIC RHINITIS, SEASONAL 01/01/2011  . EPISTAXIS, RECURRENT 01/01/2011   Kerin Perna, PTA 09/17/19 5:23 PM  [place], [place], [address] Phone: [phone]   Fax:  [phone]  Name: Ashley Fischer MRN: [id number] Date of Birth: [birth year]

## 2019-09-24 ENCOUNTER — Other Ambulatory Visit: Payer: Self-pay

## 2019-09-24 ENCOUNTER — Encounter: Payer: Self-pay | Admitting: Physical Therapy

## 2019-09-24 ENCOUNTER — Ambulatory Visit (INDEPENDENT_AMBULATORY_CARE_PROVIDER_SITE_OTHER): Payer: 59 | Admitting: Physical Therapy

## 2019-09-24 DIAGNOSIS — M6281 Muscle weakness (generalized): Secondary | ICD-10-CM | POA: Diagnosis not present

## 2019-09-24 DIAGNOSIS — R2689 Other abnormalities of gait and mobility: Secondary | ICD-10-CM

## 2019-09-24 DIAGNOSIS — R600 Localized edema: Secondary | ICD-10-CM

## 2019-09-24 DIAGNOSIS — M25661 Stiffness of right knee, not elsewhere classified: Secondary | ICD-10-CM | POA: Diagnosis not present

## 2019-09-24 NOTE — Therapy (Signed)
[facility] [place] [address] [place], [place], [address] Phone: [phone]   Fax:  [phone]  Physical Therapy Treatment  Patient Details  Name: Ashley Fischer MRN: [id number] Date of Birth: [birth year] Referring Provider (PT): Reed Breech MD   Encounter Date: 09/24/2019  PT End of Session - 09/24/19 1052    Visit Number  12    Number of Visits  24    Date for PT Re-Evaluation  10/29/19    PT Start Time  1015    PT Stop Time  1058    PT Time Calculation (min)  43 min    Activity Tolerance  Patient tolerated treatment well;No increased pain    Behavior During Therapy  [facility] for tasks assessed/performed       History reviewed. No pertinent past medical history.  Past Surgical History:  Procedure Laterality Date  . ARTHROSCOPIC REPAIR ACL      There were no vitals filed for this visit.  Subjective Assessment - 09/24/19 1019    Subjective  Pt reports she is still having clicking in lateral part of Rt knee with heel slides.  She had some aching in Rt knee with sleeping (up to 3/10); resolved in morning.    Pertinent History  ACL left knee    Patient Stated Goals  to get back to playing basketball    Currently in Pain?  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 (Z85.885O)    Referring Provider (PT)  Reed Breech MD    Onset Date/Surgical Date  08/03/19    Hand Dominance  Right    Next MD Visit  09/26/19    Prior Therapy  yes      PROM   Right/Left Knee  Right    Right Knee Extension  0    Right Knee Flexion  12      Strength   Strength Assessment Site  Hip    Right/Left Hip  Right    Right Hip Flexion  --   5-/5   Right Hip Extension  --   5-/5   Right Hip ABduction  5/5    Right Hip ADduction  4+/5      [facility]  Adult PT Treatment/Exercise - 09/24/19 0001      Knee/Hip Exercises: Stretches   Passive Hamstring Stretch  Right;2 reps;30 seconds   supine with strap   ITB Stretch  Right;2 reps;20 seconds    Gastroc Stretch  Both;2 reps;20 seconds      Knee/Hip Exercises: Aerobic   Recumbent Bike  slow full revolutions (no resistance) on bike for Rt knee ROM. x 7 min       Knee/Hip Exercises: Supine   Heel Slides  AAROM;Right;10 reps   with strap   Straight Leg Raises Limitations  10 reps RLE in long sitting       Knee/Hip Exercises: Sidelying   Hip ADduction  Strengthening;Right;Left;1 set;20 reps;5 reps   top leg on chair      Vasopneumatic   Number Minutes Vasopneumatic   10 minutes    Vasopnuematic Location   Knee   Rt   Vasopneumatic Pressure  Medium    Vasopneumatic Temperature  34 deg       Manual Therapy   Manual therapy comments  removed ktape from last application.  Reapplied in x pattern over area of swelling (mid-shin, medial) to decompress tissue.             PT Short Term Goals - 09/05/19 1109      PT SHORT TERM GOAL #1   Title  Initial rehab program addressing ROM and active movement per protocol    Time  4    Period  Weeks    Status  On-going    Target Date  09/03/19      PT SHORT TERM GOAL #2   Title  ROM Rt knee 0 degrees extension to 90 degrees flexion    Time  4    Period  Weeks    Status  Achieved    Target Date  09/03/19      PT SHORT TERM GOAL #3   Title  Patient able to tolerate 50% WB on RLE with gait    Baseline  -----    Time  6    Period  Weeks    Status  Achieved    Target Date  09/17/19      PT SHORT TERM GOAL #4   Title  decreased inflammation in Rt knee to within 1 cm of left knee    Time  6    Period  Weeks    Status  Achieved    Target Date  09/17/19      PT SHORT TERM GOAL #5   Title  -----------        PT Long Term Goals - 09/17/19 1723      PT LONG TERM GOAL #1   Title  I with advanced HEP    Time  12    Period   Weeks    Status  On-going      PT LONG TERM GOAL #2   Title  increase Rt LE strength to 5/5    Time  12    Period  Weeks    Status  On-going      PT LONG TERM GOAL #3   Title  Improved right knee flexion to 100 deg    Time  12    Period  Weeks    Status  Achieved      PT LONG TERM GOAL #4   Title  Patient to demo no extensor lag with SLR    Time  12    Period  Weeks    Status  Achieved      PT LONG TERM GOAL #5   Title  Patient able to ambulate with a normal gait pattern and climb stairs without difficulty.    Time  12    Period  Weeks    Status  On-going            Plan - 09/24/19 1258    Clinical Impression Statement  Pt's Rt knee ROM now 0-123 deg.  She demonstrates a strong quad contraction and is able to complete SLR without extensor lag.  Pt had small area of swelling in her Rt anterior medial shin (about the size of 1/2 golf ball); reduced with vasopneumatic and elevation.  Pt has made good progress towards goals, within rehab protocol.    Stability/Clinical Decision Making  Stable/Uncomplicated    Rehab Potential  Excellent    PT Frequency  2x / week    PT Duration  12 weeks  PT Treatment/Interventions  ADLs/Self Care Home Management;Cryotherapy;Occupational psychologist;Therapeutic exercise;Balance training;Neuromuscular re-education;Patient/family education;Manual techniques;Scar mobilization;Passive range of motion;Taping;Vasopneumatic Device    PT Next Visit Plan  await further information regarding rehab protocol (8-12 wks)    Consulted and Agree with Plan of Care  Patient       Patient will benefit from skilled therapeutic intervention in order to improve the following deficits and impairments:  Impaired sensation, Decreased range of motion, Decreased strength, Decreased activity tolerance, Increased edema, Pain, Difficulty walking  Visit Diagnosis: Stiffness of right knee, not elsewhere classified  Muscle weakness  (generalized)  Localized edema  Other abnormalities of gait and mobility     Problem List Patient Active Problem List   Diagnosis Date Noted  . Left anterior cruciate ligament tear 09/08/2015  . ALLERGIC RHINITIS, SEASONAL 01/01/2011  . EPISTAXIS, RECURRENT 01/01/2011   Kerin Perna, PTA 09/24/19 1:03 PM  [place], [place], [address] Phone: [phone]   Fax:  [phone]  Name: Ashley Fischer MRN: [id number] Date of Birth: 12-10-2000

## 2019-09-28 ENCOUNTER — Other Ambulatory Visit: Payer: Self-pay

## 2019-09-28 ENCOUNTER — Ambulatory Visit (INDEPENDENT_AMBULATORY_CARE_PROVIDER_SITE_OTHER): Payer: 59 | Admitting: Physical Therapy

## 2019-09-28 DIAGNOSIS — M6281 Muscle weakness (generalized): Secondary | ICD-10-CM | POA: Diagnosis not present

## 2019-09-28 DIAGNOSIS — M25661 Stiffness of right knee, not elsewhere classified: Secondary | ICD-10-CM

## 2019-09-28 DIAGNOSIS — R2689 Other abnormalities of gait and mobility: Secondary | ICD-10-CM | POA: Diagnosis not present

## 2019-09-28 DIAGNOSIS — R600 Localized edema: Secondary | ICD-10-CM

## 2019-09-28 NOTE — Therapy (Signed)
[place], [place], [address] Phone: [phone]   Fax:  [phone]  Physical Therapy Treatment  Patient Details  Name: Ashley Fischer MRN: [id number] Date of Birth: December 23, 2000 Referring Provider (PT): Erie Noe MD   Encounter Date: 09/28/2019  PT End of Session - 09/28/19 1115    Visit Number  13    Number of Visits  24    Date for PT Re-Evaluation  10/29/19    PT Start Time  1106    PT Stop Time  1149    PT Time Calculation (min)  43 min    Activity Tolerance  Patient tolerated treatment well;No increased pain       No past medical history on file.  Past Surgical History:  Procedure Laterality Date  . ARTHROSCOPIC REPAIR ACL      There were no vitals filed for this visit.  Subjective Assessment - 09/28/19 1120    Subjective  Pt presents with single axillary crutch with brace locked in ext; papers from MD office.  See media:."Ms. Stockman can begin to walk wiht knee brace open and begin to wean the brace in 1-2 weeks pending progress. Can also begin week 12 (phase 2) on the protocol as pt tolerates."    Patient Stated Goals  to get back to playing basketball    Currently in Pain?  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 (W97.989Q)    Referring Provider (PT)  Erie Noe MD    Onset Date/Surgical Date  08/03/19    Hand Dominance  Right    Next MD Visit  09/26/19    Prior Therapy  yes       [facility] Adult PT Treatment/Exercise - 09/28/19 0001      Ambulation/Gait   Ambulation Distance (Feet)  240 Feet    Assistive device  L Axillary Crutch    Gait Pattern  Step-through pattern;Decreased arm swing - right;Decreased weight shift to right    Pre-Gait Activities  Rt knee flex/ext in staggered  stance x 10 with axillary crutch    Gait Comments  cues for increased Rt heel strike, increased Rt knee flex during swing through - improved quality with increased distance.       Knee/Hip Exercises: Aerobic   Recumbent Bike  slow full revolutions to L1 x 5 min       Knee/Hip Exercises: Standing   Heel Raises  Both;1 set;20 reps   with UE support on rail; and Toe raises   Heel Raises Limitations  cues for even weight    Hip Flexion  Stengthening;Right;Left;1 set;10 reps;Knee straight    Hip Abduction  10 reps;Knee straight;Stengthening;Right;Left;1 set    Hip Extension  Stengthening;Left;Right;1 set;10 reps    Forward Step Up  Left;1 set;10 reps;Hand Hold: 2   3" step = 45 deg knee bend   Functional Squat  1 set;20 reps   mini squat to 45 deg   SLS  weight shifts side to side, then Rt SLS x 10 sec x 3 reps with intermittent support.     Other Standing Knee Exercises  standing hamstring curls x 10 reps RLE  Vasopneumatic   Number Minutes Vasopneumatic   10 minutes    Vasopnuematic Location   Knee   Rt   Vasopneumatic Pressure  Medium    Vasopneumatic Temperature   34 deg                PT Short Term Goals - 09/28/19 1435      PT SHORT TERM GOAL #1   Title  Initial rehab program addressing ROM and active movement per protocol    Time  4    Period  Weeks    Status  Achieved    Target Date  09/03/19      PT SHORT TERM GOAL #2   Title  ROM Rt knee 0 degrees extension to 90 degrees flexion    Time  4    Period  Weeks    Status  Achieved    Target Date  09/03/19      PT SHORT TERM GOAL #3   Title  Patient able to tolerate 50% WB on RLE with gait    Baseline  -----    Time  6    Period  Weeks    Status  Achieved    Target Date  09/17/19      PT SHORT TERM GOAL #4   Title  decreased inflammation in Rt knee to within 1 cm of left knee    Time  6    Period  Weeks    Status  Achieved    Target Date  09/17/19      PT SHORT TERM GOAL #5   Title  -----------         PT Long Term Goals - 09/17/19 1723      PT LONG TERM GOAL #1   Title  I with advanced HEP    Time  12    Period  Weeks    Status  On-going      PT LONG TERM GOAL #2   Title  increase Rt LE strength to 5/5    Time  12    Period  Weeks    Status  On-going      PT LONG TERM GOAL #3   Title  Improved right knee flexion to 100 deg    Time  12    Period  Weeks    Status  Achieved      PT LONG TERM GOAL #4   Title  Patient to demo no extensor lag with SLR    Time  12    Period  Weeks    Status  Achieved      PT LONG TERM GOAL #5   Title  Patient able to ambulate with a normal gait pattern and climb stairs without difficulty.    Time  12    Period  Weeks    Status  On-going            Plan - 09/28/19 1431    Clinical Impression Statement  Pt has been allowed to progress to wk 12 exercises per MD.  Pt tolerated all new exercises well without any difficulty or pain in Rt knee. Quality of gait improved with short trial with brace opened and cues. Pt has met all STGs and is progressing well towards LTG.    Stability/Clinical Decision Making  Stable/Uncomplicated    Rehab Potential  Excellent    PT Frequency  2x / week    PT Duration  12 weeks    PT Treatment/Interventions  ADLs/Self Care Home Management;Cryotherapy;Occupational psychologist;Therapeutic exercise;Balance training;Neuromuscular re-education;Patient/family education;Manual techniques;Scar mobilization;Passive range of motion;Taping;Vasopneumatic Device    PT Next Visit Plan  progress per rehab protocol.    Consulted and Agree with Plan of Care  Patient       Patient will benefit from skilled therapeutic intervention in order to improve the following deficits and impairments:  Impaired sensation, Decreased range of motion, Decreased strength, Decreased activity tolerance, Increased edema, Pain, Difficulty walking  Visit Diagnosis: Stiffness of right knee, not elsewhere  classified  Muscle weakness (generalized)  Localized edema  Other abnormalities of gait and mobility     Problem List Patient Active Problem List   Diagnosis Date Noted  . Left anterior cruciate ligament tear 09/08/2015  . ALLERGIC RHINITIS, SEASONAL 01/01/2011  . EPISTAXIS, RECURRENT 01/01/2011   Kerin Perna, PTA 09/28/19 2:35 PM  [place], [place], [address] Phone: [phone]   Fax:  [phone]  Name: Ashley Fischer MRN: [id number] Date of Birth: 03-30-2001

## 2019-10-01 ENCOUNTER — Encounter: Payer: 59 | Admitting: Physical Therapy

## 2019-10-02 ENCOUNTER — Other Ambulatory Visit: Payer: Self-pay

## 2019-10-02 ENCOUNTER — Ambulatory Visit (INDEPENDENT_AMBULATORY_CARE_PROVIDER_SITE_OTHER): Payer: 59 | Admitting: Physical Therapy

## 2019-10-02 ENCOUNTER — Encounter: Payer: Self-pay | Admitting: Physical Therapy

## 2019-10-02 DIAGNOSIS — M6281 Muscle weakness (generalized): Secondary | ICD-10-CM

## 2019-10-02 DIAGNOSIS — R600 Localized edema: Secondary | ICD-10-CM

## 2019-10-02 DIAGNOSIS — M25661 Stiffness of right knee, not elsewhere classified: Secondary | ICD-10-CM | POA: Diagnosis not present

## 2019-10-02 NOTE — Therapy (Addendum)
[place], [place], [address] Phone: [phone]   Fax:  [phone]  Physical Therapy Treatment  Patient Details  Name: Ashley Fischer MRN: [id number] Date of Birth: 2001/04/03 Referring Provider (PT): Erie Noe MD   Encounter Date: 10/02/2019  PT End of Session - 10/02/19 0846    Visit Number  14    Number of Visits  24    Date for PT Re-Evaluation  10/29/19    PT Start Time  0803    PT Stop Time  6440    PT Time Calculation (min)  50 min    Activity Tolerance  Patient tolerated treatment well;No increased pain       History reviewed. No pertinent past medical history.  Past Surgical History:  Procedure Laterality Date  . ARTHROSCOPIC REPAIR ACL      There were no vitals filed for this visit.  Subjective Assessment - 10/02/19 0820    Subjective  Pt reports she did some aquatic exercises with athletic trainer at school last week.  She is now driving, and ambulates with brace and single axillary crutch. No other changes since last visit.    Pertinent History  ACL left knee    Patient Stated Goals  to get back to playing basketball    Currently in Pain?  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)    Referring Provider (PT)  Erie Noe MD    Onset Date/Surgical Date  08/03/19    Hand Dominance  Right    Next MD Visit  10/24/19    Prior Therapy  yes      Restrictions   Weight Bearing Restrictions  No    RLE Weight Bearing  Weight bearing as tolerated      PROM   Right Knee Extension  0    Right Knee Flexion  123      Flexibility   Soft Tissue Assessment /Muscle Length  yes    Quadriceps  Rt: 117 deg (prone quad stretch)       [facility] Adult PT Treatment/Exercise - 10/02/19 0001      Ambulation/Gait   Ambulation Distance  (Feet)  160 Feet    Assistive device  None    Gait Pattern  Step-through pattern;Decreased weight shift to right    Pre-Gait Activities  Rt knee flex/ext in staggered stance x 10     Gait Comments  cues for even weight shift, Shorten Rt stride, and roll through Rt foot.       Self-Care   Other Self-Care Comments   pt eduated on scar mobilization; pt returned demo with cues.       Knee/Hip Exercises: Stretches   Passive Hamstring Stretch  Right;2 reps;30 seconds    Quad Stretch  Right;2 reps;30 seconds    ITB Stretch  Right;2 reps;30 seconds    Gastroc Stretch  Both;2 reps;20 seconds   incline board   Soleus Stretch  Both;2 reps;20 seconds      Knee/Hip Exercises: Aerobic   Recumbent Bike  L2: 5 min       Knee/Hip Exercises: Standing   Lateral Step Up  Right;1 set;15 reps   3" step   Forward Step Up  Left;1 set;Hand Hold: 2;15 reps  3" step = 45 deg knee bend     Knee/Hip Exercises: Seated   Other Seated Knee/Hip Exercises  long sitting: Rt SLR x 10      Knee/Hip Exercises: Supine   Heel Slides  Right   1 rep for measurement     Vasopneumatic   Number Minutes Vasopneumatic   10 minutes    Vasopnuematic Location   Knee   Rt   Vasopneumatic Pressure  Medium    Vasopneumatic Temperature   34 deg       Manual Therapy   Manual therapy comments  I strip of Rock tape applied in zig zag pattern over incisions of Rt knee to assist with scar managemt.  Squid shaped piece applied to medial right knee to assist with edema reduction               PT Short Term Goals - 09/28/19 1435      PT SHORT TERM GOAL #1   Title  Initial rehab program addressing ROM and active movement per protocol    Time  4    Period  Weeks    Status  Achieved    Target Date  09/03/19      PT SHORT TERM GOAL #2   Title  ROM Rt knee 0 degrees extension to 90 degrees flexion    Time  4    Period  Weeks    Status  Achieved    Target Date  09/03/19      PT SHORT TERM GOAL #3   Title  Patient  able to tolerate 50% WB on RLE with gait    Baseline  -----    Time  6    Period  Weeks    Status  Achieved    Target Date  09/17/19      PT SHORT TERM GOAL #4   Title  decreased inflammation in Rt knee to within 1 cm of left knee    Time  6    Period  Weeks    Status  Achieved    Target Date  09/17/19      PT SHORT TERM GOAL #5   Title  -----------        PT Long Term Goals - 09/17/19 1723      PT LONG TERM GOAL #1   Title  I with advanced HEP    Time  12    Period  Weeks    Status  On-going      PT LONG TERM GOAL #2   Title  increase Rt LE strength to 5/5    Time  12    Period  Weeks    Status  On-going      PT LONG TERM GOAL #3   Title  Improved right knee flexion to 100 deg    Time  12    Period  Weeks    Status  Achieved      PT LONG TERM GOAL #4   Title  Patient to demo no extensor lag with SLR    Time  12    Period  Weeks    Status  Achieved      PT LONG TERM GOAL #5   Title  Patient able to ambulate with a normal gait pattern and climb stairs without difficulty.    Time  12    Period  Weeks    Status  On-going  Plan - 10/02/19 1320    Clinical Impression Statement  Pt's ROM similar to previous session; added stretches to increase knee flexion.  Pt able to demonstrate pain-free gait with minimal deviations without crutches.  Pt tolerating exercises well without any symptoms.  PRogressing well towards remaining goals.    Stability/Clinical Decision Making  Stable/Uncomplicated    Rehab Potential  Excellent    PT Frequency  2x / week    PT Duration  12 weeks    PT Treatment/Interventions  ADLs/Self Care Home Management;Cryotherapy;Retail banker;Therapeutic exercise;Balance training;Neuromuscular re-education;Patient/family education;Manual techniques;Scar mobilization;Passive range of motion;Taping;Vasopneumatic Device    PT Next Visit Plan  progress per rehab protocol.  [email or web address]   Consulted and  Agree with Plan of Care  Patient       Patient will benefit from skilled therapeutic intervention in order to improve the following deficits and impairments:  Impaired sensation, Decreased range of motion, Decreased strength, Decreased activity tolerance, Increased edema, Pain, Difficulty walking  Visit Diagnosis: Stiffness of right knee, not elsewhere classified  Muscle weakness (generalized)  Localized edema     Problem List Patient Active Problem List   Diagnosis Date Noted  . Left anterior cruciate ligament tear 09/08/2015  . ALLERGIC RHINITIS, SEASONAL 01/01/2011  . EPISTAXIS, RECURRENT 01/01/2011   Mayer Camel, PTA 10/02/19 1:23 PM  [facility] [place] [address] [place], [place], [address] Phone: [phone]   Fax:  [phone]  Name: ISMELDA WEATHERMAN MRN: [id number] Date of Birth: 01-20-2001

## 2019-10-03 ENCOUNTER — Encounter: Payer: 59 | Admitting: Physical Therapy

## 2019-10-03 NOTE — Patient Instructions (Signed)
Access Code: [email or web address]  URL: [email or web address]  Date: 10/02/2019  Prepared by: Mayer Camel   Program Notes  closed chain exercises to 45 deg (avoid greater than 60 deg knee flexion)   Exercises  Step Up - 10 reps - 3 sets - 1x daily - 7x weekly  Mini Squat with Chair - 10 reps - 3 sets - 1x daily - 7x weekly  Heel Raise - 10 reps - 2 sets - 1x daily - 7x weekly  Mini Lunge - 10 reps - 2 sets - 1x daily - 7x weekly  Standing Hip Abduction - 10 reps - 2 sets - 1x daily - 7x weekly  Standing Hip Extension - 10 reps - 2 sets - 1x daily - 7x weekly  Gastroc Stretch on Wall - 2 reps - 1 sets - 30 hold - 2x daily - 7x weekly  Prone Quadriceps Stretch with Strap - 3 reps - 1 sets - 30 hold - 2x daily - 7x weekly  Supine ITB Stretch with Strap - 3 reps - 1 sets - 2x daily - 7x weekly  Hooklying Hamstring Stretch with Strap - 3 reps - 1 sets - 2x daily - 7x weekly

## 2019-10-04 ENCOUNTER — Ambulatory Visit (INDEPENDENT_AMBULATORY_CARE_PROVIDER_SITE_OTHER): Payer: 59 | Admitting: Rehabilitative and Restorative Service Providers"

## 2019-10-04 ENCOUNTER — Other Ambulatory Visit: Payer: Self-pay

## 2019-10-04 ENCOUNTER — Encounter: Payer: Self-pay | Admitting: Rehabilitative and Restorative Service Providers"

## 2019-10-04 DIAGNOSIS — R600 Localized edema: Secondary | ICD-10-CM

## 2019-10-04 DIAGNOSIS — R2689 Other abnormalities of gait and mobility: Secondary | ICD-10-CM | POA: Diagnosis not present

## 2019-10-04 DIAGNOSIS — M6281 Muscle weakness (generalized): Secondary | ICD-10-CM

## 2019-10-04 DIAGNOSIS — M25661 Stiffness of right knee, not elsewhere classified: Secondary | ICD-10-CM

## 2019-10-04 DIAGNOSIS — M25561 Pain in right knee: Secondary | ICD-10-CM

## 2019-10-04 NOTE — Therapy (Signed)
[facility] [place] [address] [place], [place], [address] Phone: [phone]   Fax:  [phone]  Physical Therapy Treatment  Patient Details  Name: Ashley Fischer MRN: [id number] Date of Birth: [birth year] Referring Provider (PT): Reed Breech MD   Encounter Date: 10/04/2019  PT End of Session - 10/04/19 0804    Visit Number  15    Number of Visits  24    Date for PT Re-Evaluation  10/29/19    PT Start Time  0801    PT Stop Time  0850    PT Time Calculation (min)  49 min    Activity Tolerance  Patient tolerated treatment well;No increased pain       History reviewed. No pertinent past medical history.  Past Surgical History:  Procedure Laterality Date  . ARTHROSCOPIC REPAIR ACL      There were no vitals filed for this visit.  Subjective Assessment - 10/04/19 0803    Subjective  The patient is doing well with HEP and walking without crutches.    Pertinent History  ACL left knee    Patient Stated Goals  to get back to playing basketball    Currently in Pain?  No/denies                       [facility] Adult PT Treatment/Exercise - 10/04/19 0807      Ambulation/Gait   Ambulation/Gait  Yes    Ambulation/Gait Assistance  6: Modified independent (Device/Increase time)   brace donned during gait   Ambulation Distance (Feet)  150 Feet    Assistive device  None    Gait Pattern  Step-through pattern;Decreased weight shift to right    Gait Comments  also walked 15 ft x 2 reps without brace (patient notes short distance walking occasionally at home to bathroom)..  She demonstrates good heel strike, slowed loading into mid stance on R side.      Exercises   Exercises  Knee/Hip;Ankle      Knee/Hip Exercises: Stretches   Gastroc Stretch  Both;30 seconds;2 reps      Knee/Hip Exercises: Aerobic   Recumbent Bike  x 5 minutes       Knee/Hip Exercises: Standing   Heel Raises  Both;1 set;10 reps    Heel Raises  Limitations  then R with UE support x 8 reps with cues for quad engagement    Hip Abduction  15 reps    Abduction Limitations  sidestepping 5 reps R and L x 3 sets    Lateral Step Up  Right;10 reps   4" step   Forward Step Up  Right;10 reps   4" step   Forward Step Up Limitations  dec'ing UE support    Functional Squat  10 reps    Functional Squat Limitations  0-60 degrees mini squat, then a split lunge with R leg anterior     SLS  right SLS prior to attempting heel raise.    Other Standing Knee Exercises  Foam standing for proprioceptive training with wide to narrow base and eyes closed x 30 seconds x 3 reps.    Other Standing Knee Exercises  Standing step down posterior and anteriorly from 2" foam for compliant surface and stability training.        Knee/Hip Exercises: Supine   Quad Sets  Strengthening;Right;10 reps    Straight Leg Raises  Strengthening;Right;10 reps      Knee/Hip Exercises: Sidelying   Hip  ABduction  Strengthening;Right;10 reps      Knee/Hip Exercises: Prone   Hamstring Curl  10 reps    Hip Extension  Strengthening;Left;10 reps      Vasopneumatic   Number Minutes Vasopneumatic   10 minutes    Vasopnuematic Location   Knee    Vasopneumatic Pressure  Medium    Vasopneumatic Temperature   34 deg             PT Education - 10/04/19 0844    Education Details  progressed HEP    Person(s) Educated  Patient    Methods  Explanation;Demonstration;Handout    Comprehension  Returned demonstration;Verbalized understanding       PT Short Term Goals - 09/28/19 1435      PT SHORT TERM GOAL #1   Title  Initial rehab program addressing ROM and active movement per protocol    Time  4    Period  Weeks    Status  Achieved    Target Date  09/03/19      PT SHORT TERM GOAL #2   Title  ROM Rt knee 0 degrees extension to 90 degrees flexion    Time  4    Period  Weeks    Status  Achieved    Target Date  09/03/19      PT SHORT TERM GOAL #3   Title  Patient able  to tolerate 50% WB on RLE with gait    Baseline  -----    Time  6    Period  Weeks    Status  Achieved    Target Date  09/17/19      PT SHORT TERM GOAL #4   Title  decreased inflammation in Rt knee to within 1 cm of left knee    Time  6    Period  Weeks    Status  Achieved    Target Date  09/17/19      PT SHORT TERM GOAL #5   Title  -----------        PT Long Term Goals - 09/17/19 1723      PT LONG TERM GOAL #1   Title  I with advanced HEP    Time  12    Period  Weeks    Status  On-going      PT LONG TERM GOAL #2   Title  increase Rt LE strength to 5/5    Time  12    Period  Weeks    Status  On-going      PT LONG TERM GOAL #3   Title  Improved right knee flexion to 100 deg    Time  12    Period  Weeks    Status  Achieved      PT LONG TERM GOAL #4   Title  Patient to demo no extensor lag with SLR    Time  12    Period  Weeks    Status  Achieved      PT LONG TERM GOAL #5   Title  Patient able to ambulate with a normal gait pattern and climb stairs without difficulty.    Time  12    Period  Weeks    Status  On-going            Plan - 10/04/19 0843    Clinical Impression Statement  The patient tolerated addition of compliant surface training, increasing weight shift to the right side during standing and SLR without  brace donned well.  PT added/progressed HEP adding SLR in all planes (supine/sidelying/prone) without brace donned.  Continue to progress per protocol wtihin tolerable range.    PT Treatment/Interventions  ADLs/Self Care Home Management;Cryotherapy;Occupational psychologist;Therapeutic exercise;Balance training;Neuromuscular re-education;Patient/family education;Manual techniques;Scar mobilization;Passive range of motion;Taping;Vasopneumatic Device    PT Next Visit Plan  progress per rehab protocol.    Consulted and Agree with Plan of Care  Patient       Patient will benefit from skilled therapeutic intervention in  order to improve the following deficits and impairments:  Impaired sensation, Decreased range of motion, Decreased strength, Decreased activity tolerance, Increased edema, Pain, Difficulty walking  Visit Diagnosis: Stiffness of right knee, not elsewhere classified  Muscle weakness (generalized)  Localized edema  Other abnormalities of gait and mobility  Acute pain of right knee     Problem List Patient Active Problem List   Diagnosis Date Noted  . Left anterior cruciate ligament tear 09/08/2015  . ALLERGIC RHINITIS, SEASONAL 01/01/2011  . EPISTAXIS, RECURRENT 01/01/2011    Detron Carras 10/04/2019, 8:45 AM  [facility] [place], [place], [address] Phone: [phone]   Fax:  [phone]  Name: AZUREE MINISH MRN: [id number] Date of Birth: 12/17/2000

## 2019-10-04 NOTE — Patient Instructions (Signed)
Access Code: [email or web address]  URL: [email or web address]  Date: 10/04/2019  Prepared by: Margretta Ditty   Program Notes  closed chain exercises to 45 deg (avoid greater than 60 deg knee flexion)   Exercises Step Up - 10 reps - 3 sets - 1x daily - 7x weekly Mini Squat with Chair - 10 reps - 3 sets - 1x daily - 7x weekly Heel Raise - 10 reps - 2 sets - 1x daily - 7x weekly Mini Lunge - 10 reps - 2 sets - 1x daily - 7x weekly Standing Hip Abduction - 10 reps - 2 sets - 1x daily - 7x weekly Standing Hip Extension - 10 reps - 2 sets - 1x daily - 7x weekly Gastroc Stretch on Wall - 2 reps - 1 sets - 30 hold - 2x daily - 7x weekly Prone Quadriceps Stretch with Strap - 3 reps - 1 sets - 30 hold - 2x daily - 7x weekly Supine ITB Stretch with Strap - 3 reps - 1 sets - 2x daily - 7x weekly Hooklying Hamstring Stretch with Strap - 3 reps - 1 sets - 2x daily                            - 7x weekly Supine Active Straight Leg Raise - 10 reps - 1 sets - 2x daily - 7x weekly Sidelying Hip Abduction - 10 reps - 1 sets - 2x daily - 7x weekly Prone Hip Extension - 10 reps - 1 sets - 2x daily - 7x weekly

## 2019-10-08 ENCOUNTER — Encounter: Payer: 59 | Admitting: Physical Therapy

## 2019-10-09 ENCOUNTER — Encounter: Payer: 59 | Admitting: Physical Therapy

## 2019-10-10 ENCOUNTER — Encounter: Payer: 59 | Admitting: Physical Therapy

## 2019-10-10 ENCOUNTER — Other Ambulatory Visit: Payer: Self-pay

## 2019-10-10 ENCOUNTER — Encounter (INDEPENDENT_AMBULATORY_CARE_PROVIDER_SITE_OTHER): Payer: Self-pay

## 2019-10-10 ENCOUNTER — Encounter: Payer: Self-pay | Admitting: Physical Therapy

## 2019-10-10 ENCOUNTER — Ambulatory Visit (INDEPENDENT_AMBULATORY_CARE_PROVIDER_SITE_OTHER): Payer: 59 | Admitting: Physical Therapy

## 2019-10-10 DIAGNOSIS — R600 Localized edema: Secondary | ICD-10-CM | POA: Diagnosis not present

## 2019-10-10 DIAGNOSIS — M25661 Stiffness of right knee, not elsewhere classified: Secondary | ICD-10-CM | POA: Diagnosis not present

## 2019-10-10 DIAGNOSIS — M6281 Muscle weakness (generalized): Secondary | ICD-10-CM

## 2019-10-10 DIAGNOSIS — R2689 Other abnormalities of gait and mobility: Secondary | ICD-10-CM | POA: Diagnosis not present

## 2019-10-10 NOTE — Therapy (Addendum)
[facility] [place], [place], [address] Phone: [phone]   Fax:  [phone]  Physical Therapy Treatment  Patient Details  Name: Ashley Fischer MRN: [id number] Date of Birth: June 26, 2001 Referring Provider (PT): Erie Noe MD   Encounter Date: 10/10/2019  PT End of Session - 10/10/19 1435    Visit Number  16    Number of Visits  24    Date for PT Re-Evaluation  10/29/19    PT Start Time  9798    PT Stop Time  1438    PT Time Calculation (min)  42 min    Activity Tolerance  Patient tolerated treatment well;No increased pain       History reviewed. No pertinent past medical history.  Past Surgical History:  Procedure Laterality Date  . ARTHROSCOPIC REPAIR ACL      There were no vitals filed for this visit.  Subjective Assessment - 10/10/19 1404    Subjective  Pt reports that the PT at school athletic program said she doesn't need to wear brace anymore.  She has been cleared for underwater treadmill. Exercises otherwise are going well.    Patient Stated Goals  to get back to playing basketball    Currently in Pain?  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 (X21.194R)    Referring Provider (PT)  Erie Noe MD    Onset Date/Surgical Date  08/03/19    Hand Dominance  Right    Next MD Visit  10/24/19    Prior Therapy  yes      Flexibility   Quadriceps  Rt: 124 deg (prone quad stretch)       [facility] Adult PT Treatment/Exercise - 10/10/19 0001      Knee/Hip Exercises: Stretches   Sports administrator  Right;3 reps   prone with strap     Knee/Hip Exercises: Aerobic   Tread Mill  retro gait @ -1.3 mph x 2.5 min     Recumbent Bike  L2: 3 min       Knee/Hip Exercises: Machines for Strengthening   Total Gym Leg Press  5 plates BLE, 7-40 deg knee flexion (seat at  10 and feet at top of board) x 20 reps      Knee/Hip Exercises: Standing   Terminal Knee Extension  Strengthening;Right;1 set;10 reps    Theraband Level (Terminal Knee Extension)  Level 2 (Red)   5 sec hold   Lateral Step Up  Right;1 set;15 reps;Hand Hold: 1;Step Height: 6"    Forward Step Up  Right;1 set;15 reps;Hand Hold: 2;Step Height: 6"   retro step back with RLE   Forward Step Up Limitations  slight discomfort with stepping down with LLE, changed to stepping down with RLE (retro)    Step Down  Left;1 set;10 reps   Rt retro step up; mirror for feedback. , 3" step   Step Down Limitations  cues to not Rt hip hike.     SLS  Rt SLS on Bosu x 30 sec x 2 reps with intermittent UE support.      Hip flexor stretch (trial in sitting, too awkward for knee), performed in thomas test position on high/low table x 30 sec each leg.  PT Short Term Goals - 09/28/19 1435      PT SHORT TERM GOAL #1   Title  Initial rehab program addressing ROM and active movement per protocol    Time  4    Period  Weeks    Status  Achieved    Target Date  09/03/19      PT SHORT TERM GOAL #2   Title  ROM Rt knee 0 degrees extension to 90 degrees flexion    Time  4    Period  Weeks    Status  Achieved    Target Date  09/03/19      PT SHORT TERM GOAL #3   Title  Patient able to tolerate 50% WB on RLE with gait    Baseline  -----    Time  6    Period  Weeks    Status  Achieved    Target Date  09/17/19      PT SHORT TERM GOAL #4   Title  decreased inflammation in Rt knee to within 1 cm of left knee    Time  6    Period  Weeks    Status  Achieved    Target Date  09/17/19      PT SHORT TERM GOAL #5   Title  -----------        PT Long Term Goals - 09/17/19 1723      PT LONG TERM GOAL #1   Title  I with advanced HEP    Time  12    Period  Weeks    Status  On-going      PT LONG TERM GOAL #2   Title  increase Rt LE strength to 5/5    Time  12    Period  Weeks    Status   On-going      PT LONG TERM GOAL #3   Title  Improved right knee flexion to 100 deg    Time  12    Period  Weeks    Status  Achieved      PT LONG TERM GOAL #4   Title  Patient to demo no extensor lag with SLR    Time  12    Period  Weeks    Status  Achieved      PT LONG TERM GOAL #5   Title  Patient able to ambulate with a normal gait pattern and climb stairs without difficulty.    Time  12    Period  Weeks    Status  On-going            Plan - 10/10/19 1432    Clinical Impression Statement  Pt able to tolerate forward step ups on 6" step (60 deg knee flexion) without difficulty, however had some discomfort with LLE retrostep down.  Pt tolerated all other exericses well and is progressing well within rehab protocol.    Rehab Potential  Excellent    PT Frequency  2x / week    PT Duration  12 weeks    PT Treatment/Interventions  ADLs/Self Care Home Management;Cryotherapy;Retail banker;Therapeutic exercise;Balance training;Neuromuscular re-education;Patient/family education;Manual techniques;Scar mobilization;Passive range of motion;Taping;Vasopneumatic Device    PT Next Visit Plan  progress per rehab protocol.    Consulted and Agree with Plan of Care  Patient       Patient will benefit from skilled therapeutic intervention in order to improve the following deficits and impairments:  Impaired sensation, Decreased range of motion,  Decreased strength, Decreased activity tolerance, Increased edema, Pain, Difficulty walking  Visit Diagnosis: Stiffness of right knee, not elsewhere classified  Muscle weakness (generalized)  Localized edema  Other abnormalities of gait and mobility     Problem List Patient Active Problem List   Diagnosis Date Noted  . Left anterior cruciate ligament tear 09/08/2015  . ALLERGIC RHINITIS, SEASONAL 01/01/2011  . EPISTAXIS, RECURRENT 01/01/2011   Mayer Camel, PTA 10/10/19 5:03 PM   [facility] [place] [address] [place], [place], [address] Phone: [phone]   Fax:  [phone]  Name: Ashley Fischer MRN: [id number] Date of Birth: [birth year]

## 2019-10-11 ENCOUNTER — Encounter: Payer: 59 | Admitting: Physical Therapy

## 2019-10-16 ENCOUNTER — Other Ambulatory Visit: Payer: Self-pay

## 2019-10-16 ENCOUNTER — Encounter: Payer: Self-pay | Admitting: Rehabilitative and Restorative Service Providers"

## 2019-10-16 ENCOUNTER — Ambulatory Visit (INDEPENDENT_AMBULATORY_CARE_PROVIDER_SITE_OTHER): Payer: 59 | Admitting: Rehabilitative and Restorative Service Providers"

## 2019-10-16 DIAGNOSIS — R600 Localized edema: Secondary | ICD-10-CM

## 2019-10-16 DIAGNOSIS — M25661 Stiffness of right knee, not elsewhere classified: Secondary | ICD-10-CM | POA: Diagnosis not present

## 2019-10-16 DIAGNOSIS — R2689 Other abnormalities of gait and mobility: Secondary | ICD-10-CM

## 2019-10-16 DIAGNOSIS — M25561 Pain in right knee: Secondary | ICD-10-CM

## 2019-10-16 DIAGNOSIS — M6281 Muscle weakness (generalized): Secondary | ICD-10-CM | POA: Diagnosis not present

## 2019-10-16 NOTE — Therapy (Signed)
[facility] [place] [address] [place], [place], [address] Phone: [phone]   Fax:  [phone]  Physical Therapy Treatment  Patient Details  Name: Ashley Fischer MRN: [id number] Date of Birth: 2001-05-04 Referring Provider (PT): Reed Breech MD   Encounter Date: 10/16/2019  PT End of Session - 10/16/19 0838    Visit Number  17    Number of Visits  24    Date for PT Re-Evaluation  10/29/19    PT Start Time  0800    PT Stop Time  0845    PT Time Calculation (min)  45 min    Activity Tolerance  Patient tolerated treatment well;No increased pain    Behavior During Therapy  [facility] for tasks assessed/performed       History reviewed. No pertinent past medical history.  Past Surgical History:  Procedure Laterality Date  . ARTHROSCOPIC REPAIR ACL      There were no vitals filed for this visit.  Subjective Assessment - 10/16/19 0800    Subjective  The patient reports she is working out with Event organiser at school and has added weights to standing SLR in all planes and has started leg press.  The patient notes R knee discomfort with steps ups, but no pain overall.    Pertinent History  ACL left knee    Patient Stated Goals  to get back to playing basketball    Currently in Pain?  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)    Referring Provider (PT)  Reed Breech MD    Onset Date/Surgical Date  08/03/19    Hand Dominance  Right    Next MD Visit  10/24/19    Prior Therapy  yes                   [facility] Adult PT Treatment/Exercise - 10/16/19 0758      Ambulation/Gait   Ambulation/Gait  Yes    Ambulation/Gait Assistance  7: Independent    Ambulation Distance (Feet)  200 Feet    Assistive device  None    Gait Comments  30 feet walking anterior posteriorly emphasizing heel to ground to  get terminal knee extension      Exercises   Exercises  Knee/Hip;Ankle      Knee/Hip Exercises: Stretches   Other Knee/Hip Stretches  stretch into knee flexion by pulling knee to chest for AAROM flexion; hamstring stretch PROM      Knee/Hip Exercises: Aerobic   Recumbent Bike  L2:  5 minutes      Knee/Hip Exercises: Machines for Strengthening   Total Gym Leg Press  5 plates BLE, 6-38 deg knee flexion (seat at 10 and feet at top of board)      Knee/Hip Exercises: Standing   Heel Raises  Right;10 reps    Terminal Knee Extension  Strengthening;Right   12 reps   Terminal Knee Extension Limitations  ball press into wall    Hip Abduction  3 sets;15 reps;Stengthening    Abduction Limitations  Used sidestepping x 30 feet each direction for strengthening    Lateral Step Up  Right;10 reps;1 set    Lateral Step Up Limitations  cues to reduce momentum use with last 5 degrees of extension "don't pop your knee back."    Forward  Step Up  Right;10 reps;1 set    Step Down  Right;10 reps    Step Down Limitations  on compliant foam x 2 sets    SLS  Compliant surface standing x 30 seconds x 3 reps R LE    Other Standing Knee Exercises  Compliant surface standing for proprioceptive knee training      Modalities   Modalities  Vasopneumatic      Vasopneumatic   Number Minutes Vasopneumatic   10 minutes    Vasopnuematic Location   Knee    Vasopneumatic Pressure  Medium    Vasopneumatic Temperature   34 deg               PT Short Term Goals - 09/28/19 1435      PT SHORT TERM GOAL #1   Title  Initial rehab program addressing ROM and active movement per protocol    Time  4    Period  Weeks    Status  Achieved    Target Date  09/03/19      PT SHORT TERM GOAL #2   Title  ROM Rt knee 0 degrees extension to 90 degrees flexion    Time  4    Period  Weeks    Status  Achieved    Target Date  09/03/19      PT SHORT TERM GOAL #3   Title  Patient able to tolerate 50% WB on RLE with gait     Baseline  -----    Time  6    Period  Weeks    Status  Achieved    Target Date  09/17/19      PT SHORT TERM GOAL #4   Title  decreased inflammation in Rt knee to within 1 cm of left knee    Time  6    Period  Weeks    Status  Achieved    Target Date  09/17/19      PT SHORT TERM GOAL #5   Title  -----------        PT Long Term Goals - 09/17/19 1723      PT LONG TERM GOAL #1   Title  I with advanced HEP    Time  12    Period  Weeks    Status  On-going      PT LONG TERM GOAL #2   Title  increase Rt LE strength to 5/5    Time  12    Period  Weeks    Status  On-going      PT LONG TERM GOAL #3   Title  Improved right knee flexion to 100 deg    Time  12    Period  Weeks    Status  Achieved      PT LONG TERM GOAL #4   Title  Patient to demo no extensor lag with SLR    Time  12    Period  Weeks    Status  Achieved      PT LONG TERM GOAL #5   Title  Patient able to ambulate with a normal gait pattern and climb stairs without difficulty.    Time  12    Period  Weeks    Status  On-going            Plan - 10/16/19 5009    Clinical Impression Statement  The patient is tolerating single limb stance and single limb heel raises today without pain.  She notes quad fatigue with step ups and step downs from compliant surfaces.  PT progressing closed chain activities to tolerance.    Rehab Potential  Excellent    PT Frequency  2x / week    PT Duration  12 weeks    PT Treatment/Interventions  ADLs/Self Care Home Management;Cryotherapy;Retail banker;Therapeutic exercise;Balance training;Neuromuscular re-education;Patient/family education;Manual techniques;Scar mobilization;Passive range of motion;Taping;Vasopneumatic Device    PT Next Visit Plan  progress per rehab protocol.    PT Home Exercise Plan  ankle pumps, quad sets x 5 sec, calf stretch with towel, HS stretch with ice, hip ABD with brace    Consulted and Agree with Plan of  Care  Patient       Patient will benefit from skilled therapeutic intervention in order to improve the following deficits and impairments:  Impaired sensation, Decreased range of motion, Decreased strength, Decreased activity tolerance, Increased edema, Pain, Difficulty walking  Visit Diagnosis: Stiffness of right knee, not elsewhere classified  Muscle weakness (generalized)  Localized edema  Other abnormalities of gait and mobility  Acute pain of right knee     Problem List Patient Active Problem List   Diagnosis Date Noted  . Left anterior cruciate ligament tear 09/08/2015  . ALLERGIC RHINITIS, SEASONAL 01/01/2011  . EPISTAXIS, RECURRENT 01/01/2011    Keyly Baldonado 10/16/2019, 8:40 AM  [facility] [address] [place], [place], [address] Phone: [phone]   Fax:  [phone]  Name: NETTIE WYFFELS MRN: [id number] Date of Birth: Jul 17, 2001

## 2019-10-18 ENCOUNTER — Other Ambulatory Visit: Payer: Self-pay

## 2019-10-18 ENCOUNTER — Ambulatory Visit (INDEPENDENT_AMBULATORY_CARE_PROVIDER_SITE_OTHER): Payer: 59 | Admitting: Rehabilitative and Restorative Service Providers"

## 2019-10-18 ENCOUNTER — Encounter: Payer: Self-pay | Admitting: Rehabilitative and Restorative Service Providers"

## 2019-10-18 DIAGNOSIS — R2689 Other abnormalities of gait and mobility: Secondary | ICD-10-CM

## 2019-10-18 DIAGNOSIS — M25661 Stiffness of right knee, not elsewhere classified: Secondary | ICD-10-CM | POA: Diagnosis not present

## 2019-10-18 DIAGNOSIS — M6281 Muscle weakness (generalized): Secondary | ICD-10-CM

## 2019-10-18 DIAGNOSIS — R600 Localized edema: Secondary | ICD-10-CM | POA: Diagnosis not present

## 2019-10-18 DIAGNOSIS — M25561 Pain in right knee: Secondary | ICD-10-CM

## 2019-10-18 NOTE — Therapy (Signed)
[facility] [place] [address] [place], [place], [address] Phone: [phone]   Fax:  [phone]  Physical Therapy Treatment  Patient Details  Name: Ashley Fischer MRN: [id number] Date of Birth: [birth year] Referring Provider (PT): Reed Breech MD   Encounter Date: 10/18/2019  PT End of Session - 10/18/19 0805    Visit Number  18    Number of Visits  24    Date for PT Re-Evaluation  10/29/19    PT Start Time  0803    PT Stop Time  0855    PT Time Calculation (min)  52 min    Activity Tolerance  Patient tolerated treatment well;No increased pain    Behavior During Therapy  [facility] for tasks assessed/performed       History reviewed. No pertinent past medical history.  Past Surgical History:  Procedure Laterality Date  . ARTHROSCOPIC REPAIR ACL      There were no vitals filed for this visit.  Subjective Assessment - 10/18/19 0804    Subjective  The patient reports no pain and no soreness after last session.    Pertinent History  ACL left knee    Patient Stated Goals  to get back to playing basketball    Currently in Pain?  No/denies                       [facility] Adult PT Treatment/Exercise - 10/18/19 0810      Self-Care   Self-Care  Other Self-Care Comments    Other Self-Care Comments   scar tissue mobilization in circular, side to side and up and down motions x 8 minutes      Exercises   Exercises  Knee/Hip;Ankle      Knee/Hip Exercises: Aerobic   Recumbent Bike  L3:  5 minutes      Knee/Hip Exercises: Machines for Strengthening   Total Gym Leg Press  6 plates B LEs  seat at 10 x 15 reps, 2 sets      Knee/Hip Exercises: Standing   Knee Flexion  Strengthening;Right;10 reps    Terminal Knee Extension  Strengthening;Right;10 reps;2 sets    Terminal Knee Extension Limitations  ball press into wall    Hip Abduction  Stengthening;Right;Left;10 reps;4 sets    Abduction Limitations  Sidestepping squats  with 45 degrees    Lateral Step Up  Right;10 reps;1 set    Functional Squat  10 reps    Functional Squat Limitations  Anterior, lateral and posterior R stance sliding pillow case with L LE allowing 0-45 degreees R knee flexion for multi-directional control    Stairs  5 steps (4 and 6" heights) x 5 reps without difficulty using reciprocal pattern.    SLS  Compliant surface standing x 30 seconds x 3 reps R LE    Other Standing Knee Exercises  Compliant surface step downs posteriroly (0-45 R leg) x 12 reps and lateral step down x 12 reps.      Modalities   Modalities  Vasopneumatic      Vasopneumatic   Number Minutes Vasopneumatic   10 minutes    Vasopnuematic Location   Knee    Vasopneumatic Pressure  Medium    Vasopneumatic Temperature   34 deg               PT Short Term Goals - 09/28/19 1435      PT SHORT TERM GOAL #1   Title  Initial rehab program addressing  ROM and active movement per protocol    Time  4    Period  Weeks    Status  Achieved    Target Date  09/03/19      PT SHORT TERM GOAL #2   Title  ROM Rt knee 0 degrees extension to 90 degrees flexion    Time  4    Period  Weeks    Status  Achieved    Target Date  09/03/19      PT SHORT TERM GOAL #3   Title  Patient able to tolerate 50% WB on RLE with gait    Baseline  -----    Time  6    Period  Weeks    Status  Achieved    Target Date  09/17/19      PT SHORT TERM GOAL #4   Title  decreased inflammation in Rt knee to within 1 cm of left knee    Time  6    Period  Weeks    Status  Achieved    Target Date  09/17/19      PT SHORT TERM GOAL #5   Title  -----------        PT Long Term Goals - 10/18/19 0805      PT LONG TERM GOAL #1   Title  I with advanced HEP    Time  12    Period  Weeks    Status  On-going    Target Date  10/29/19      PT LONG TERM GOAL #2   Title  increase Rt LE strength to 5/5    Time  12    Period  Weeks    Status  On-going      PT LONG TERM GOAL #3   Title   Improved right knee flexion to 100 deg    Baseline  Standing R knee flexion to 110 degrees.    Time  12    Period  Weeks    Status  Achieved      PT LONG TERM GOAL #4   Title  Patient to demo no extensor lag with SLR    Time  12    Period  Weeks    Status  Achieved      PT LONG TERM GOAL #5   Title  Patient able to ambulate with a normal gait pattern and climb stairs without difficulty.    Baseline  The patient demonstrates a normal gait pattern and reciprocal pattern on steps without difficulty or use of handrails.    Time  12    Period  Weeks    Status  Achieved            Plan - 10/18/19 6295    Clinical Impression Statement  The patient is tolerating progression of closed chain activities adding mini squat side-step and more dynamic mini squat activities for quad/hamstring contraction.  PT to continue working towards [place] with patient meeting 3/5 LTGs at this time with a normalizing gait and stair pattern, improved ROM, and improved quad control.    Rehab Potential  Excellent    PT Frequency  2x / week    PT Duration  12 weeks    PT Treatment/Interventions  ADLs/Self Care Home Management;Cryotherapy;Occupational psychologist;Therapeutic exercise;Balance training;Neuromuscular re-education;Patient/family education;Manual techniques;Scar mobilization;Passive range of motion;Taping;Vasopneumatic Device    PT Next Visit Plan  progress per rehab protocol.    PT Home Exercise Plan  ankle pumps, quad sets  x 5 sec, calf stretch with towel, HS stretch with ice, hip ABD with brace    Consulted and Agree with Plan of Care  Patient       Patient will benefit from skilled therapeutic intervention in order to improve the following deficits and impairments:  Impaired sensation, Decreased range of motion, Decreased strength, Decreased activity tolerance, Increased edema, Pain, Difficulty walking  Visit Diagnosis: Stiffness of right knee, not elsewhere  classified  Muscle weakness (generalized)  Localized edema  Other abnormalities of gait and mobility  Acute pain of right knee     Problem List Patient Active Problem List   Diagnosis Date Noted  . Left anterior cruciate ligament tear 09/08/2015  . ALLERGIC RHINITIS, SEASONAL 01/01/2011  . EPISTAXIS, RECURRENT 01/01/2011    Odaly Peri, PT 10/18/2019, 9:04 AM  [facility] [address] [place], [place], [address] Phone: [phone]   Fax:  [phone]  Name: Ashley Fischer MRN: [id number] Date of Birth: 04-18-2001

## 2019-10-23 ENCOUNTER — Ambulatory Visit (INDEPENDENT_AMBULATORY_CARE_PROVIDER_SITE_OTHER): Payer: 59 | Admitting: Physical Therapy

## 2019-10-23 ENCOUNTER — Other Ambulatory Visit: Payer: Self-pay

## 2019-10-23 DIAGNOSIS — M25661 Stiffness of right knee, not elsewhere classified: Secondary | ICD-10-CM

## 2019-10-23 DIAGNOSIS — R600 Localized edema: Secondary | ICD-10-CM | POA: Diagnosis not present

## 2019-10-23 DIAGNOSIS — M6281 Muscle weakness (generalized): Secondary | ICD-10-CM

## 2019-10-23 NOTE — Therapy (Signed)
[facility]  [place], [place], [address] Phone: [phone]   Fax:  [phone]  Physical Therapy Treatment  Patient Details  Name: Ashley Fischer MRN: [id number] Date of Birth: April 15, 2001 Referring Provider (PT): [place] Noe MD   Encounter Date: 10/23/2019  PT End of Session - 10/23/19 0808    Visit Number  19    Number of Visits  24    Date for PT Re-Evaluation  10/29/19    PT Start Time  0801    PT Stop Time  0850    PT Time Calculation (min)  49 min    Activity Tolerance  Patient tolerated treatment well;No increased pain    Behavior During Therapy  [facility] for tasks assessed/performed       No past medical history on file.  Past Surgical History:  Procedure Laterality Date  . ARTHROSCOPIC REPAIR ACL      There were no vitals filed for this visit.  Subjective Assessment - 10/23/19 0808    Subjective  Pt reports she has been doing jogging and defensive drills on underwater treadmill. She returns to MD tomorrow.    Patient Stated Goals  to get back to playing basketball    Currently in Pain?  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 (Q25.956L)    Referring Provider (PT)  Erie Noe MD    Onset Date/Surgical Date  08/03/19    Hand Dominance  Right    Next MD Visit  10/24/19    Prior Therapy  yes      PROM   PROM Assessment Site  Knee    Right/Left Knee  Right    Right Knee Extension  0    Right Knee Flexion  135   supine heel slide with strap     Strength   Strength Assessment Site  Hip;Knee    Right/Left Hip  Right    Right Hip Flexion  5/5    Right Hip Extension  5/5    Right Hip External Rotation   5/5    Right Hip Internal Rotation  5/5    Right Hip ABduction  5/5    Right Hip ADduction  4+/5    Right/Left Knee  Right;Left    Right Knee  Flexion  --   5-/5   Right Knee Extension  4+/5    Left Knee Flexion  4+/5    Left Knee Extension  5/5      Flexibility   Quadriceps  Rt knee 127 deg flexion, prone with strap       [facility] Adult PT Treatment/Exercise - 10/23/19 0001      Knee/Hip Exercises: Stretches   Passive Hamstring Stretch  Right;Left;2 reps;30 seconds    Quad Stretch  Right;2 reps;30 seconds    Gastroc Stretch  Both;2 reps;30 seconds      Knee/Hip Exercises: Aerobic   Recumbent Bike  L4: 5 min       Knee/Hip Exercises: Machines for Strengthening   Total Gym Leg Press  8 plates, BLE, knee flexion 0-60 deg, 20 reps       Knee/Hip Exercises: Standing   Lateral Step Up  Right;1 set;15 reps;Step Height: 6";Hand Hold: 1    Step Down  Left;2 sets;10 reps  retro step up with Rt, 3" step   Step Down Limitations  1 set of 5 of 6" steps, cue to avoid Rt hip hike.     SLS  Rt SLS on upside down bosu x 30 sec;   Rt SLS forward leans to touch 12" step x 10.     SLS with Vectors  Rt SLS on blue pad, with ball toss to rebounder at 10 and 2 o'clock      Vasopneumatic   Number Minutes Vasopneumatic   10 minutes    Vasopnuematic Location   Knee   Rt   Vasopneumatic Pressure  Medium    Vasopneumatic Temperature   34 deg               PT Short Term Goals - 09/28/19 1435      PT SHORT TERM GOAL #1   Title  Initial rehab program addressing ROM and active movement per protocol    Time  4    Period  Weeks    Status  Achieved    Target Date  09/03/19      PT SHORT TERM GOAL #2   Title  ROM Rt knee 0 degrees extension to 90 degrees flexion    Time  4    Period  Weeks    Status  Achieved    Target Date  09/03/19      PT SHORT TERM GOAL #3   Title  Patient able to tolerate 50% WB on RLE with gait    Baseline  -----    Time  6    Period  Weeks    Status  Achieved    Target Date  09/17/19      PT SHORT TERM GOAL #4   Title  decreased inflammation in Rt knee to within 1 cm of left knee    Time  6     Period  Weeks    Status  Achieved    Target Date  09/17/19      PT SHORT TERM GOAL #5   Title  -----------        PT Long Term Goals - 10/23/19 1610      PT LONG TERM GOAL #1   Title  I with advanced HEP    Time  12    Period  Weeks    Status  On-going      PT LONG TERM GOAL #2   Title  increase Rt LE strength to 5/5    Time  12    Period  Weeks    Status  Partially Met      PT LONG TERM GOAL #3   Title  Improved right knee flexion to 100 deg    Time  12    Period  Weeks    Status  Achieved      PT LONG TERM GOAL #4   Title  Patient to demo no extensor lag with SLR    Time  12    Period  Weeks    Status  Achieved      PT LONG TERM GOAL #5   Title  Patient able to ambulate with a normal gait pattern and climb stairs without difficulty.    Baseline  The patient demonstrates a normal gait pattern and reciprocal pattern on steps without difficulty or use of handrails.    Time  12    Period  Weeks    Status  Achieved  Plan - 10/23/19 1343    Clinical Impression Statement  Pt has demonstrated improved Rt knee ROM and strength.  She continues with functional strength deficits for Rt quad, with Lt step downs from 3"-6" step.  Gait pattern has normalized.  Pt has met a majority of goals and awaits progression of protocol to assist with return to sport.    Rehab Potential  Excellent    PT Frequency  2x / week    PT Duration  12 weeks    PT Treatment/Interventions  ADLs/Self Care Home Management;Cryotherapy;Occupational psychologist;Therapeutic exercise;Balance training;Neuromuscular re-education;Patient/family education;Manual techniques;Scar mobilization;Passive range of motion;Taping;Vasopneumatic Device    PT Next Visit Plan  progress per rehab protocol. focus on eccentric strengthening of Rt quad within guidelines.    Consulted and Agree with Plan of Care  Patient       Patient will benefit from skilled therapeutic  intervention in order to improve the following deficits and impairments:  Impaired sensation, Decreased range of motion, Decreased strength, Decreased activity tolerance, Increased edema, Pain, Difficulty walking  Visit Diagnosis: Stiffness of right knee, not elsewhere classified  Muscle weakness (generalized)  Localized edema     Problem List Patient Active Problem List   Diagnosis Date Noted  . Left anterior cruciate ligament tear 09/08/2015  . ALLERGIC RHINITIS, SEASONAL 01/01/2011  . EPISTAXIS, RECURRENT 01/01/2011   Kerin Perna, PTA 10/23/19 1:47 PM  [place], [place], [address] Phone: [phone]   Fax:  [phone]  Name: Ashley Fischer MRN: [id number] Date of Birth: Oct 14, 2000

## 2019-10-24 ENCOUNTER — Telehealth: Payer: Self-pay | Admitting: Physical Therapy

## 2019-10-24 NOTE — Telephone Encounter (Signed)
Patient called to inform PTA of follow up appt with surgeon today.  She stated that she received a cortisone injection in her knee today and was told to hold off on physical therapy and exercise for 1 week. Patient inquired if she should keep or cancel tomorrow's appt; informed her she should cancel and reschedule future appts to 1 wk post injection per MD orders.   Patient also stated she was to have no impact exercises for an additional 4 weeks (after she returns to MD); she is cleared to increase resistance on bike and weights for RLE.   Mayer Camel, PTA 10/24/19 5:07 PM

## 2019-10-25 ENCOUNTER — Encounter: Payer: 59 | Admitting: Physical Therapy

## 2019-11-01 ENCOUNTER — Encounter: Payer: 59 | Admitting: Physical Therapy

## 2019-11-06 ENCOUNTER — Other Ambulatory Visit: Payer: Self-pay

## 2019-11-06 ENCOUNTER — Ambulatory Visit (INDEPENDENT_AMBULATORY_CARE_PROVIDER_SITE_OTHER): Payer: 59 | Admitting: Physical Therapy

## 2019-11-06 DIAGNOSIS — R600 Localized edema: Secondary | ICD-10-CM

## 2019-11-06 DIAGNOSIS — M25661 Stiffness of right knee, not elsewhere classified: Secondary | ICD-10-CM

## 2019-11-06 DIAGNOSIS — R2689 Other abnormalities of gait and mobility: Secondary | ICD-10-CM | POA: Diagnosis not present

## 2019-11-06 DIAGNOSIS — M6281 Muscle weakness (generalized): Secondary | ICD-10-CM | POA: Diagnosis not present

## 2019-11-06 NOTE — Therapy (Signed)
[place], [place], [address] Phone: [phone]   Fax:  [phone]  Physical Therapy Treatment and Renewal Summary  Patient Details  Name: Ashley Fischer MRN: [id number] Date of Birth: 11/28/2000 Referring Provider (PT): Erie Noe MD   Encounter Date: 11/06/2019  PT End of Session - 11/06/19 0801    Visit Number  20    PT Start Time  0801    PT Stop Time  0762    PT Time Calculation (min)  49 min    Activity Tolerance  Patient tolerated treatment well;No increased pain    Behavior During Therapy  [facility] for tasks assessed/performed       No past medical history on file.  Past Surgical History:  Procedure Laterality Date  . ARTHROSCOPIC REPAIR ACL      There were no vitals filed for this visit.  Subjective Assessment - 11/06/19 0907    Subjective  "Look how skinny my knee is!"  Pt reports she did not do anything but stretch and ice for one week post-injection in knee.  She has done a couple water workouts with trainer at school.    Pertinent History  ACL left knee    Patient Stated Goals  to get back to playing basketball    Currently in Pain?  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)    Referring Provider (PT)  Erie Noe MD    Onset Date/Surgical Date  08/03/19    Hand Dominance  Right    Next MD Visit  10/24/19    Prior Therapy  yes      PROM   Right/Left Knee  Right;Left    Right Knee Extension  0    Right Knee Flexion  138    Left Knee Extension  0    Left Knee Flexion  135      Strength   Strength Assessment Site  Hip;Knee    Right Hip Flexion  5/5    Right Hip Extension  5/5    Right Hip ABduction  --   5-/5   Right Hip ADduction  5/5    Right Knee Flexion  --   5-/5   Right Knee Extension  --   5-/5   Left  Knee Flexion  --   5-/5   Left Knee Extension  5/5      Flexibility   Quadriceps  Rt knee 130 deg,  Lt knee 130 deg       [facility] Adult PT Treatment/Exercise - 11/06/19 0001      Knee/Hip Exercises: Stretches   Passive Hamstring Stretch  Right;Left;2 reps;30 seconds    Passive Hamstring Stretch Limitations  bilat hamstring stretch     Quad Stretch  Right;Left;2 reps;30 seconds      Knee/Hip Exercises: Aerobic   Recumbent Bike  L4: 4 min       Knee/Hip Exercises: Machines for Strengthening   Total Gym Leg Press  6 plates RLE x 10, 7 plates x 10      Knee/Hip Exercises: Standing   Lateral Step Up  Right;1 set;10 reps;Hand Hold: 0;Step Height: 6"   with Lt hip abdct   Forward Step Up  Right;5 reps;Step Height: 6";Hand Hold: 0  SLS  Rt SLS on upside down bosu x 30 sec, 2 reps with horiz and vertical head turns    Other Standing Knee Exercises  warrior I with back heel raises x 12 reps each leg;  repeated with hip ext x 10 each leg.     Other Standing Knee Exercises  Rt Warrior II held 30 sec (knee flex of 40 deg), then small pulses for 30 seconds.   mini squat (45 deg) to heel raise (bilat) x 15 reps      Knee/Hip Exercises: Supine   Single Leg Bridge  Strengthening;Right;Left;1 set;10 reps    Straight Leg Raise with External Rotation  Strengthening;Right;1 set;10 reps      Knee/Hip Exercises: Sidelying   Hip ABduction  Strengthening;Right;1 set;10 reps   5 pulses each rep     Vasopneumatic   Number Minutes Vasopneumatic   10 minutes    Vasopnuematic Location   Knee   Rt   Vasopneumatic Pressure  Medium    Vasopneumatic Temperature   34 deg      Manual Therapy   Manual therapy comments  I strip of Rock tape applied in zig zag pattern over incisions of Rt knee to assist with scar managemt               PT Short Term Goals - 09/28/19 1435      PT SHORT TERM GOAL #1   Title  Initial rehab program addressing ROM and active movement per protocol    Time  4     Period  Weeks    Status  Achieved    Target Date  09/03/19      PT SHORT TERM GOAL #2   Title  ROM Rt knee 0 degrees extension to 90 degrees flexion    Time  4    Period  Weeks    Status  Achieved    Target Date  09/03/19      PT SHORT TERM GOAL #3   Title  Patient able to tolerate 50% WB on RLE with gait    Baseline  -----    Time  6    Period  Weeks    Status  Achieved    Target Date  09/17/19      PT SHORT TERM GOAL #4   Title  decreased inflammation in Rt knee to within 1 cm of left knee    Time  6    Period  Weeks    Status  Achieved    Target Date  09/17/19      PT SHORT TERM GOAL #5   Title  -----------        PT Long Term Goals - 10/23/19 5631      PT LONG TERM GOAL #1   Title  I with advanced HEP    Time  12    Period  Weeks    Status  On-going      PT LONG TERM GOAL #2   Title  increase Rt LE strength to 5/5    Time  12    Period  Weeks    Status  Partially Met      PT LONG TERM GOAL #3   Title  Improved right knee flexion to 100 deg    Time  12    Period  Weeks    Status  Achieved      PT LONG TERM GOAL #4   Title  Patient to demo no extensor lag  with SLR    Time  12    Period  Weeks    Status  Achieved      PT LONG TERM GOAL #5   Title  Patient able to ambulate with a normal gait pattern and climb stairs without difficulty.    Baseline  The patient demonstrates a normal gait pattern and reciprocal pattern on steps without difficulty or use of handrails.    Time  12    Period  Weeks    Status  Achieved        UPDATED LONG TERM GOALS: PT Long Term Goals - 11/06/19 1308      PT LONG TERM GOAL #1   Title  I with advanced HEP    Time  6    Period  Weeks    Status  Revised    Target Date  12/18/19      PT LONG TERM GOAL #2   Title  increase Rt LE strength to 5/5    Time  6    Period  Weeks    Status  Revised    Target Date  12/18/19      PT LONG TERM GOAL #3   Title  The patient will tolerate jogging x 2 minutes to begin  return to sport specific training.  *AS PROTOCOL PROGRESSION ALLOWS    Time  6    Period  Weeks    Status  New    Target Date  12/18/19      PT LONG TERM GOAL #4   Title  The patient will tolerate plyometric/ agiity ladder training to begin return to sport specific training.  *AS PROTOCOL PROGRESSION ALLOWS    Time  6    Period  Weeks    Status  New    Target Date  12/18/19      PT LONG TERM GOAL #5   Title  The patient will perform single leg hopping x 10 reps R LE.  *AS PROTOCOL PROGRESSION ALLOWS    Time  6    Period  Weeks    Status  New    Target Date  12/18/19          Plan - 11/06/19 0909    Clinical Impression Statement  Pt demonstrates improved RLE strength; slight strength deficits in Rt hip abdct and knee flexion.  Pt reported some mild discomfort in Rt patellar tendon with 6" step downs.  Reviewed exercises within protocol parameters.  Pt has met majority of LTGs and awaits next phase of protocol per MD; supervising PT will update goals this visit.    Rehab Potential  Excellent    PT Frequency  2x / week    PT Duration  12 weeks    PT Treatment/Interventions  ADLs/Self Care Home Management;Cryotherapy;Occupational psychologist;Therapeutic exercise;Balance training;Neuromuscular re-education;Patient/family education;Manual techniques;Scar mobilization;Passive range of motion;Taping;Vasopneumatic Device    PT Next Visit Plan  progress per rehab protocol. focus on eccentric strengthening of Rt quad/ hip abductors within guidelines.    Consulted and Agree with Plan of Care  Patient       Patient will benefit from skilled therapeutic intervention in order to improve the following deficits and impairments:  Impaired sensation, Decreased range of motion, Decreased strength, Decreased activity tolerance, Increased edema, Pain, Difficulty walking  Visit Diagnosis: Stiffness of right knee, not elsewhere classified  Muscle weakness  (generalized)  Localized edema  Other abnormalities of gait and mobility     Problem List Patient  Active Problem List   Diagnosis Date Noted  . Left anterior cruciate ligament tear 09/08/2015  . ALLERGIC RHINITIS, SEASONAL 01/01/2011  . EPISTAXIS, RECURRENT 01/01/2011    Rudell Cobb, [place], PTA 11/06/19 9:21 AM  [facility] [place], [place], [address] Phone: [phone]   Fax:  [phone]  Name: Ashley Fischer MRN: [id number] Date of Birth: [birth year]

## 2019-11-08 ENCOUNTER — Encounter: Payer: 59 | Admitting: Physical Therapy

## 2019-11-13 ENCOUNTER — Ambulatory Visit (INDEPENDENT_AMBULATORY_CARE_PROVIDER_SITE_OTHER): Payer: 59 | Admitting: Physical Therapy

## 2019-11-13 ENCOUNTER — Other Ambulatory Visit: Payer: Self-pay

## 2019-11-13 ENCOUNTER — Encounter: Payer: Self-pay | Admitting: Physical Therapy

## 2019-11-13 DIAGNOSIS — M25661 Stiffness of right knee, not elsewhere classified: Secondary | ICD-10-CM | POA: Diagnosis not present

## 2019-11-13 DIAGNOSIS — M6281 Muscle weakness (generalized): Secondary | ICD-10-CM

## 2019-11-13 DIAGNOSIS — R2689 Other abnormalities of gait and mobility: Secondary | ICD-10-CM | POA: Diagnosis not present

## 2019-11-13 DIAGNOSIS — R600 Localized edema: Secondary | ICD-10-CM

## 2019-11-13 NOTE — Therapy (Signed)
[place], [place], [address] Phone: [phone]   Fax:  [phone]  Physical Therapy Treatment  Patient Details  Name: Ashley Fischer MRN: [id number] Date of Birth: [birth year] Referring Provider (PT): Erie Noe MD   Encounter Date: 11/13/2019  PT End of Session - 11/13/19 0806    Visit Number  21    Number of Visits  32    Date for PT Re-Evaluation  12/18/19    PT Start Time  0805    PT Stop Time  0272    PT Time Calculation (min)  50 min    Activity Tolerance  Patient tolerated treatment well;No increased pain    Behavior During Therapy  [facility] for tasks assessed/performed       History reviewed. No pertinent past medical history.  Past Surgical History:  Procedure Laterality Date  . ARTHROSCOPIC REPAIR ACL      There were no vitals filed for this visit.  Subjective Assessment - 11/13/19 0808    Subjective  No new complaints today. Her f/u MD appt is 11/21/19.    Pertinent History  ACL left knee    Patient Stated Goals  to get back to playing basketball    Currently in Pain?  No/denies         [facility] PT Assessment - 11/13/19 0001      Assessment   Next MD Visit  11/21/19                   [facility] Adult PT Treatment/Exercise - 11/13/19 0001      Knee/Hip Exercises: Aerobic   Recumbent Bike  L4: 5 min       Knee/Hip Exercises: Machines for Strengthening   Total Gym Leg Press  6 plates RLE x 10, 7 plates x 10, 8 plates x 10      Knee/Hip Exercises: Standing   Lateral Step Up  1 set;10 reps;Hand Hold: 0;Step Height: 6";Both   with Lt hip abdct   Forward Step Up  Right;5 reps;Step Height: 6";Hand Hold: 0    Step Down  Right;1 set;5 reps;Hand Hold: 0;Step Height: 6"    SLS  Rt SLS on upside down bosu x 30 sec, 2 reps with horiz and vertical head turns; then SLS both with hip ABD and ext and march opp x10 ea    Other Standing Knee Exercises  warrior I with back heel raises  x 12 reps each leg    Other Standing Knee Exercises  Rt Warrior II held 30 sec (knee flex of 40 deg), then small pulses for 30 seconds.   mini squat (45 deg) to heel raise (bilat) x 15 reps; Reverse Warrior and Triangle x 20 sec ea      Knee/Hip Exercises: Supine   Single Leg Bridge  Strengthening;Right;Left;1 set;10 reps    Straight Leg Raises  Strengthening;Right;1 set;10 reps    Straight Leg Raises Limitations  with ABD and back then down    Straight Leg Raise with External Rotation  Strengthening;Right;1 set;10 reps      Knee/Hip Exercises: Sidelying   Clams  Reverse Clam x 10 Rt      Modalities   Modalities  Vasopneumatic      Vasopneumatic   Number Minutes Vasopneumatic   10 minutes    Vasopnuematic Location   Knee    Vasopneumatic Pressure  Medium    Vasopneumatic Temperature   34  PT Short Term Goals - 09/28/19 1435      PT SHORT TERM GOAL #1   Title  Initial rehab program addressing ROM and active movement per protocol    Time  4    Period  Weeks    Status  Achieved    Target Date  09/03/19      PT SHORT TERM GOAL #2   Title  ROM Rt knee 0 degrees extension to 90 degrees flexion    Time  4    Period  Weeks    Status  Achieved    Target Date  09/03/19      PT SHORT TERM GOAL #3   Title  Patient able to tolerate 50% WB on RLE with gait    Baseline  -----    Time  6    Period  Weeks    Status  Achieved    Target Date  09/17/19      PT SHORT TERM GOAL #4   Title  decreased inflammation in Rt knee to within 1 cm of left knee    Time  6    Period  Weeks    Status  Achieved    Target Date  09/17/19      PT SHORT TERM GOAL #5   Title  -----------        PT Long Term Goals - 11/06/19 1308      PT LONG TERM GOAL #1   Title  I with advanced HEP    Time  6    Period  Weeks    Status  Revised    Target Date  12/18/19      PT LONG TERM GOAL #2   Title  increase Rt LE strength to 5/5    Time  6    Period  Weeks    Status  Revised     Target Date  12/18/19      PT LONG TERM GOAL #3   Title  The patient will tolerate jogging x 2 minutes to begin return to sport specific training.  *AS PROTOCOL PROGRESSION ALLOWS    Time  6    Period  Weeks    Status  New    Target Date  12/18/19      PT LONG TERM GOAL #4   Title  The patient will tolerate plyometric/ agiity ladder training to begin return to sport specific training.  *AS PROTOCOL PROGRESSION ALLOWS    Time  6    Period  Weeks    Status  New    Target Date  12/18/19      PT LONG TERM GOAL #5   Title  The patient will perform single leg hopping x 10 reps R LE.  *AS PROTOCOL PROGRESSION ALLOWS    Time  6    Period  Weeks    Status  New    Target Date  12/18/19            Plan - 11/13/19 1431    Clinical Impression Statement  Patient continuing to do well with TE. She was able to increased wt with leg press. Still demonstrating eccentric quad weakness with step downs and having challenges with balance activities. No pain reported with TE today.    PT Treatment/Interventions  ADLs/Self Care Home Management;Cryotherapy;Retail banker;Therapeutic exercise;Balance training;Neuromuscular re-education;Patient/family education;Manual techniques;Scar mobilization;Passive range of motion;Taping;Vasopneumatic Device    PT Next Visit Plan  progress per rehab protocol. focus on eccentric strengthening  of Rt quad/ hip abductors within guidelines. MD appt 11/21/19.       Patient will benefit from skilled therapeutic intervention in order to improve the following deficits and impairments:  Impaired sensation, Decreased range of motion, Decreased strength, Decreased activity tolerance, Increased edema, Pain, Difficulty walking  Visit Diagnosis: Stiffness of right knee, not elsewhere classified  Muscle weakness (generalized)  Localized edema  Other abnormalities of gait and mobility     Problem List Patient Active Problem List    Diagnosis Date Noted  . Left anterior cruciate ligament tear 09/08/2015  . ALLERGIC RHINITIS, SEASONAL 01/01/2011  . EPISTAXIS, RECURRENT 01/01/2011    Solon Palm PT 11/13/2019, 2:37 PM  [facility] [address] [place], [place], [address] Phone: [phone]   Fax:  [phone]  Name: LAKESA COSTE MRN: [id number] Date of Birth: [birth year]

## 2019-11-13 NOTE — Addendum Note (Signed)
Addended by: Margretta Ditty M on: 11/13/2019 08:10 AM   Modules accepted: Orders

## 2019-11-15 ENCOUNTER — Encounter: Payer: 59 | Admitting: Physical Therapy

## 2019-11-22 ENCOUNTER — Ambulatory Visit (INDEPENDENT_AMBULATORY_CARE_PROVIDER_SITE_OTHER): Payer: 59 | Admitting: Physical Therapy

## 2019-11-22 ENCOUNTER — Other Ambulatory Visit: Payer: Self-pay

## 2019-11-22 DIAGNOSIS — R2689 Other abnormalities of gait and mobility: Secondary | ICD-10-CM

## 2019-11-22 DIAGNOSIS — R600 Localized edema: Secondary | ICD-10-CM

## 2019-11-22 DIAGNOSIS — M6281 Muscle weakness (generalized): Secondary | ICD-10-CM | POA: Diagnosis not present

## 2019-11-22 DIAGNOSIS — M25661 Stiffness of right knee, not elsewhere classified: Secondary | ICD-10-CM | POA: Diagnosis not present

## 2019-11-22 NOTE — Therapy (Signed)
[facility] [place] [address] [place], [place], [address] Phone: [phone]   Fax:  [phone]  Physical Therapy Treatment  Patient Details  Name: Ashley Fischer MRN: [id number] Date of Birth: January 17, 2001 Referring Provider (PT): Reed Breech MD   Encounter Date: 11/22/2019  PT End of Session - 11/22/19 1531    Visit Number  22    Number of Visits  32    Date for PT Re-Evaluation  12/18/19    PT Start Time  1450    PT Stop Time  1536    PT Time Calculation (min)  46 min    Activity Tolerance  Patient tolerated treatment well;No increased pain    Behavior During Therapy  [facility] for tasks assessed/performed       No past medical history on file.  Past Surgical History:  Procedure Laterality Date  . ARTHROSCOPIC REPAIR ACL      There were no vitals filed for this visit.  Subjective Assessment - 11/22/19 1627    Subjective  Pt reports she had visit with surgeon this week.  She is now allowed to work on strengthening exercises with 90 deg knee flexion; also allowed to perform eliptical, swimming, alter-G treadmill. No jumping, hopping, or plyometrics yet.  Pt reports she will not be allow to jump until July.  (see media for specifics from MD).  She was on altered-G treadmill this morning and it went well; she feels a little extra swelling in Rt knee since then.    Pertinent History  ACL left knee    Patient Stated Goals  to get back to playing basketball    Currently in Pain?  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     Forward Step Up  Right;10 reps;Left;5 reps   onto Bosu, eccentric control, intermittent UE support   Step Down  Right;1 set;5 reps;Hand Hold: 0;Step Height: 6"    Wall Squat  10 reps;5 seconds   2nd 5 reps, with 5# wt in/out from core. Mirror for feedback, cues for even weight.    Other Standing Knee Exercises  Rt squat with Lt leg sliding into abdct x 12, repeated with LLE sliding into hip ext.       Vasopneumatic   Number Minutes Vasopneumatic   10 minutes    Vasopnuematic Location   Knee    Vasopneumatic Pressure  Medium    Vasopneumatic Temperature   34  PT Education - 11/22/19 1630    Education Details  HEP - verbally instructed pt she can add wall slide to 90 deg and single leg squats with good form.    Person(s) Educated  Patient    Methods  Explanation    Comprehension  Verbalized understanding       PT Short Term Goals - 09/28/19 1435      PT SHORT TERM GOAL #1   Title  Initial rehab program addressing ROM and active movement per protocol    Time  4    Period  Weeks    Status  Achieved    Target Date  09/03/19      PT SHORT TERM GOAL #2   Title  ROM Rt knee 0 degrees extension to 90 degrees flexion    Time  4    Period  Weeks    Status  Achieved    Target Date  09/03/19      PT SHORT TERM GOAL #3   Title  Patient able to tolerate 50% WB on RLE with gait    Baseline  -----    Time  6    Period  Weeks    Status  Achieved    Target Date  09/17/19      PT SHORT TERM GOAL #4   Title  decreased inflammation in Rt knee to within 1 cm of left knee    Time  6    Period  Weeks    Status  Achieved    Target Date  09/17/19      PT  SHORT TERM GOAL #5   Title  -----------        PT Long Term Goals - 11/06/19 1308      PT LONG TERM GOAL #1   Title  I with advanced HEP    Time  6    Period  Weeks    Status  Revised    Target Date  12/18/19      PT LONG TERM GOAL #2   Title  increase Rt LE strength to 5/5    Time  6    Period  Weeks    Status  Revised    Target Date  12/18/19      PT LONG TERM GOAL #3   Title  The patient will tolerate jogging x 2 minutes to begin return to sport specific training.  *AS PROTOCOL PROGRESSION ALLOWS    Time  6    Period  Weeks    Status  New    Target Date  12/18/19      PT LONG TERM GOAL #4   Title  The patient will tolerate plyometric/ agiity ladder training to begin return to sport specific training.  *AS PROTOCOL PROGRESSION ALLOWS    Time  6    Period  Weeks    Status  New    Target Date  12/18/19      PT LONG TERM GOAL #5   Title  The patient will perform single leg hopping x 10 reps R LE.  *AS PROTOCOL PROGRESSION ALLOWS    Time  6    Period  Weeks    Status  New    Target Date  12/18/19            Plan - 11/22/19 1631    Clinical Impression Statement  Pt tolerated squats with greater knee bend well; RLE tremulous throughout all exercises, but pain-free.  Will continue  progress pt towards LTGs within surgeon's rehab protocols.    Rehab Potential  Excellent    PT Frequency  2x / week    PT Duration  12 weeks    PT Treatment/Interventions  ADLs/Self Care Home Management;Cryotherapy;Retail banker;Therapeutic exercise;Balance training;Neuromuscular re-education;Patient/family education;Manual techniques;Scar mobilization;Passive range of motion;Taping;Vasopneumatic Device    PT Next Visit Plan  progress per rehab protocol. focus on eccentric strengthening of Rt quad/ hip abductors within guidelines.       Patient will benefit from skilled therapeutic intervention in order to improve the following deficits and  impairments:  Impaired sensation, Decreased range of motion, Decreased strength, Decreased activity tolerance, Increased edema, Pain, Difficulty walking  Visit Diagnosis: Stiffness of right knee, not elsewhere classified  Muscle weakness (generalized)  Localized edema  Other abnormalities of gait and mobility     Problem List Patient Active Problem List   Diagnosis Date Noted  . Left anterior cruciate ligament tear 09/08/2015  . ALLERGIC RHINITIS, SEASONAL 01/01/2011  . EPISTAXIS, RECURRENT 01/01/2011   Mayer Camel, PTA 11/22/19 4:36 PM  [facility] [place] [address] [place], [place], [address] Phone: [phone]   Fax:  [phone]  Name: Ashley Fischer MRN: [id number] Date of Birth: [birth year]

## 2019-11-27 ENCOUNTER — Ambulatory Visit (INDEPENDENT_AMBULATORY_CARE_PROVIDER_SITE_OTHER): Payer: 59 | Admitting: Physical Therapy

## 2019-11-27 ENCOUNTER — Other Ambulatory Visit: Payer: Self-pay

## 2019-11-27 DIAGNOSIS — M6281 Muscle weakness (generalized): Secondary | ICD-10-CM

## 2019-11-27 DIAGNOSIS — R2689 Other abnormalities of gait and mobility: Secondary | ICD-10-CM | POA: Diagnosis not present

## 2019-11-27 DIAGNOSIS — M25661 Stiffness of right knee, not elsewhere classified: Secondary | ICD-10-CM | POA: Diagnosis not present

## 2019-11-27 NOTE — Therapy (Signed)
[facility] [place] [address] [place], [place], [address] Phone: [phone]   Fax:  [phone]  Physical Therapy Treatment  Patient Details  Name: Ashley Fischer MRN: [id number] Date of Birth: 06/19/2001 Referring Provider (PT): Reed Breech MD   Encounter Date: 11/27/2019  PT End of Session - 11/27/19 1023    Visit Number  23    Number of Visits  32    Date for PT Re-Evaluation  12/18/19    PT Start Time  0933    PT Stop Time  1015    PT Time Calculation (min)  42 min    Activity Tolerance  Patient tolerated treatment well;No increased pain    Behavior During Therapy  [facility] for tasks assessed/performed       No past medical history on file.  Past Surgical History:  Procedure Laterality Date  . ARTHROSCOPIC REPAIR ACL      There were no vitals filed for this visit.  Subjective Assessment - 11/27/19 0940    Subjective  Pt reports she is sore from working out with the strength coach yesterday with weights. She feels she can do step up/down exercise a little easier with RLE.    Pertinent History  ACL left knee    Patient Stated Goals  to get back to playing basketball    Currently in Pain?  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)    Referring Provider (PT)  Reed Breech MD    Onset Date/Surgical Date  08/03/19    Hand Dominance  Right    Next MD Visit  01/2020    Prior Therapy  yes      [facility] Adult PT Treatment/Exercise - 11/27/19 0001      Knee/Hip Exercises: Stretches   Quad Stretch  Right;Left;1 rep;20 seconds    ITB Stretch  Right;2 reps;30 seconds    Piriformis Stretch  Right;Left;2 reps;30 seconds   modified pigeon pose x 1 rep     Knee/Hip Exercises: Aerobic   Stationary Bike  L5: 2 min     Elliptical  L3: 2 min forward, 1 min backward     Other Aerobic  UBE L5: SLS on blue pad, forward 1 min, backward 1 min       Knee/Hip Exercises: Standing   Lateral Step Up  Right;1 set;10 reps   onto Bosu, eccentric control, intermittent UE support   Forward Step Up  Right;10 reps;Left;5 reps   onto Bosu, eccentric control, intermittent UE support   SLS  single leg dead lift to touch floor x 10 reps each leg.     Other Standing Knee Exercises  single leg dead lift to touch floor x 10 each leg. split squat x 10 reps each leg forward (limited depth with RLE in back)    Other Standing Knee Exercises  trial of single leg squat to elevated surface x 3 reps - stopped due to pain and poor form.   Quick step ups with foot on 12" step (able to complete 27 on LLE, 17 on RLE, 2nd rep 25 on RLE)      Knee/Hip Exercises: Seated   Sit to [employer]  10 reps   Rt foot back, Lt foot forward.      Knee/Hip Exercises: Prone   Hamstring Curl  1 set;10 reps   7# on ankle              PT Short Term Goals - 09/28/19 1435      PT SHORT TERM GOAL #1   Title  Initial rehab program addressing ROM and active movement per protocol    Time  4    Period  Weeks    Status  Achieved    Target Date  09/03/19      PT SHORT TERM GOAL #2   Title  ROM Rt knee 0 degrees extension to 90 degrees flexion    Time  4    Period  Weeks    Status  Achieved    Target Date  09/03/19      PT SHORT TERM GOAL #3   Title  Patient able to tolerate 50% WB on RLE with gait    Baseline  -----    Time  6    Period  Weeks    Status  Achieved    Target Date  09/17/19      PT SHORT TERM GOAL #4   Title  decreased inflammation in Rt knee to within 1 cm of left knee    Time  6    Period  Weeks    Status  Achieved    Target Date  09/17/19      PT SHORT TERM GOAL #5   Title  -----------        PT Long Term Goals - 11/06/19 1308      PT LONG TERM GOAL #1   Title  I with advanced HEP    Time  6    Period  Weeks     Status  Revised    Target Date  12/18/19      PT LONG TERM GOAL #2   Title  increase Rt LE strength to 5/5    Time  6    Period  Weeks    Status  Revised    Target Date  12/18/19      PT LONG TERM GOAL #3   Title  The patient will tolerate jogging x 2 minutes to begin return to sport specific training.  *AS PROTOCOL PROGRESSION ALLOWS    Time  6    Period  Weeks    Status  New    Target Date  12/18/19      PT LONG TERM GOAL #4   Title  The patient will tolerate plyometric/ agiity ladder training to begin return to sport specific training.  *AS PROTOCOL PROGRESSION ALLOWS    Time  6    Period  Weeks    Status  New    Target Date  12/18/19      PT LONG TERM GOAL #5   Title  The patient will perform single leg hopping x 10 reps R LE.  *AS PROTOCOL PROGRESSION ALLOWS    Time  6    Period  Weeks    Status  New    Target Date  12/18/19            Plan - 11/27/19 1321    Clinical Impression Statement  Pt's RLE less tremulous with step up/downs today, however continues with Rt quad weakness with other exercises. She was unable to tolerate single leg Rt squat from elevated surface due to  increased knee pain; pt had poor form with some compensation with this exercise (only 3 reps performed).  Pt making gains towards LTGs.    Rehab Potential  Excellent    PT Frequency  2x / week    PT Duration  12 weeks    PT Treatment/Interventions  ADLs/Self Care Home Management;Cryotherapy;Retail banker;Therapeutic exercise;Balance training;Neuromuscular re-education;Patient/family education;Manual techniques;Scar mobilization;Passive range of motion;Taping;Vasopneumatic Device    PT Next Visit Plan  progress per rehab protocol. focus on eccentric strengthening of Rt quad/ hip abductors within guidelines.    Consulted and Agree with Plan of Care  Patient       Patient will benefit from skilled therapeutic intervention in order to improve the following  deficits and impairments:  Impaired sensation, Decreased range of motion, Decreased strength, Decreased activity tolerance, Increased edema, Pain, Difficulty walking  Visit Diagnosis: Muscle weakness (generalized)  Stiffness of right knee, not elsewhere classified  Other abnormalities of gait and mobility     Problem List Patient Active Problem List   Diagnosis Date Noted  . Left anterior cruciate ligament tear 09/08/2015  . ALLERGIC RHINITIS, SEASONAL 01/01/2011  . EPISTAXIS, RECURRENT 01/01/2011   Mayer Camel, PTA 11/27/19 1:24 PM  [facility] [place] [address] [place], [place], [address] Phone: [phone]   Fax:  [phone]  Name: Ashley Fischer MRN: [id number] Date of Birth: [birth year]

## 2019-11-29 ENCOUNTER — Ambulatory Visit (INDEPENDENT_AMBULATORY_CARE_PROVIDER_SITE_OTHER): Payer: 59 | Admitting: Physical Therapy

## 2019-11-29 ENCOUNTER — Other Ambulatory Visit: Payer: Self-pay

## 2019-11-29 DIAGNOSIS — R2689 Other abnormalities of gait and mobility: Secondary | ICD-10-CM

## 2019-11-29 DIAGNOSIS — M25661 Stiffness of right knee, not elsewhere classified: Secondary | ICD-10-CM

## 2019-11-29 DIAGNOSIS — R600 Localized edema: Secondary | ICD-10-CM

## 2019-11-29 DIAGNOSIS — M6281 Muscle weakness (generalized): Secondary | ICD-10-CM | POA: Diagnosis not present

## 2019-11-29 NOTE — Therapy (Signed)
[place]  [place], [place], [address] Phone: [phone]   Fax:  [phone]  Physical Therapy Treatment  Patient Details  Name: Ashley Fischer MRN: [id number] Date of Birth: 2001-06-23 Referring Provider (PT): Erie Noe MD   Encounter Date: 11/29/2019  PT End of Session - 11/29/19 0930    Visit Number  24    Number of Visits  32    Date for PT Re-Evaluation  12/18/19    PT Start Time  0848    PT Stop Time  0933    PT Time Calculation (min)  45 min    Activity Tolerance  Patient tolerated treatment well;No increased pain    Behavior During Therapy  [facility] for tasks assessed/performed       No past medical history on file.  Past Surgical History:  Procedure Laterality Date  . ARTHROSCOPIC REPAIR ACL      There were no vitals filed for this visit.  Subjective Assessment - 11/29/19 0911    Subjective  Pt reports she feels her RLE is getting stronger.  She continues to ice daily after workouts.    Pertinent History  ACL left knee    Patient Stated Goals  to get back to playing basketball    Currently in Pain?  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       Knee/Hip Exercises: Standing   Heel Raises Limitations  toe walking with overhead press of 6# ball x 25 ft x 2 reps; repeated with heel walking.     Lateral Step Up Limitations  Rt foot on bosu, Lt heel  taps to side x 15 (1/2 with UE support)    Forward Step Up  Right;1 set;10 reps    Forward Step Up Limitations  stepping up onto 12" step and eccentric lowering LLE to ground.     SLS  SLS on Rt on upside down bosu with wood chop motion holding 4.5# ball x 8 reps each direction;     Other Standing Knee Exercises  split squat with RLE back x 10 (limited depth to tolerance)    Other Standing Knee Exercises  single leg dead lift to touch ground x 5 reps each side.  Side step squat touching 10# weight to floor Rt/Lt x 10;  fitter with 2 black bands x 15 reps each side.       Knee/Hip Exercises: Seated   Other Seated Knee/Hip Exercises  long sitting RLE SLR with ER x 10 (extensor lag after ~5 reps)       Knee/Hip Exercises: Supine   Bridges Limitations  reverse hamstring curls with bridge- legs on green ball x 12       Vasopneumatic  Number Minutes Vasopneumatic   10 minutes    Vasopnuematic Location   Knee    Vasopneumatic Pressure  Medium    Vasopneumatic Temperature   34 deg               PT Short Term Goals - 09/28/19 1435      PT SHORT TERM GOAL #1   Title  Initial rehab program addressing ROM and active movement per protocol    Time  4    Period  Weeks    Status  Achieved    Target Date  09/03/19      PT SHORT TERM GOAL #2   Title  ROM Rt knee 0 degrees extension to 90 degrees flexion    Time  4    Period  Weeks    Status  Achieved    Target Date  09/03/19      PT SHORT TERM GOAL #3   Title  Patient able to tolerate 50% WB on RLE with gait    Baseline  -----    Time  6    Period  Weeks    Status  Achieved    Target Date  09/17/19      PT SHORT TERM GOAL #4   Title  decreased inflammation in Rt knee to within 1 cm of left knee    Time  6    Period  Weeks    Status  Achieved    Target Date  09/17/19      PT SHORT TERM GOAL #5   Title  -----------        PT Long Term Goals - 11/06/19 1308      PT LONG TERM GOAL #1   Title  I with advanced HEP     Time  6    Period  Weeks    Status  Revised    Target Date  12/18/19      PT LONG TERM GOAL #2   Title  increase Rt LE strength to 5/5    Time  6    Period  Weeks    Status  Revised    Target Date  12/18/19      PT LONG TERM GOAL #3   Title  The patient will tolerate jogging x 2 minutes to begin return to sport specific training.  *AS PROTOCOL PROGRESSION ALLOWS    Time  6    Period  Weeks    Status  New    Target Date  12/18/19      PT LONG TERM GOAL #4   Title  The patient will tolerate plyometric/ agiity ladder training to begin return to sport specific training.  *AS PROTOCOL PROGRESSION ALLOWS    Time  6    Period  Weeks    Status  New    Target Date  12/18/19      PT LONG TERM GOAL #5   Title  The patient will perform single leg hopping x 10 reps R LE.  *AS PROTOCOL PROGRESSION ALLOWS    Time  6    Period  Weeks    Status  New    Target Date  12/18/19            Plan - 11/29/19 1246    Clinical Impression Statement  Pt demonstrated some extensor lag in Rt knee with long sitting SLRs after ~3-4 reps.  She also was not able to hold Rt heel up as high as LLE  with toe walking exercise.  She tolerated all exercises well without increase in pain.  RLE strength gradually increasing. Vaso used at end as it appeared she had some residual swelling in her knee.   Pt progressing towards all LTGs.    Rehab Potential  Excellent    PT Frequency  2x / week    PT Duration  12 weeks    PT Treatment/Interventions  ADLs/Self Care Home Management;Cryotherapy;Retail banker;Therapeutic exercise;Balance training;Neuromuscular re-education;Patient/family education;Manual techniques;Scar mobilization;Passive range of motion;Taping;Vasopneumatic Device    PT Next Visit Plan  progress per rehab protocol. focus on eccentric strengthening of Rt quad/ hip abductors within guidelines.    Consulted and Agree with Plan of Care  Patient       Patient will  benefit from skilled therapeutic intervention in order to improve the following deficits and impairments:  Impaired sensation, Decreased range of motion, Decreased strength, Decreased activity tolerance, Increased edema, Pain, Difficulty walking  Visit Diagnosis: Muscle weakness (generalized)  Stiffness of right knee, not elsewhere classified  Other abnormalities of gait and mobility  Localized edema     Problem List Patient Active Problem List   Diagnosis Date Noted  . Left anterior cruciate ligament tear 09/08/2015  . ALLERGIC RHINITIS, SEASONAL 01/01/2011  . EPISTAXIS, RECURRENT 01/01/2011   Mayer Camel, PTA 11/29/19 12:50 PM  [facility] [place] [address] [place], [place], [address] Phone: [phone]   Fax:  [phone]  Name: Ashley Fischer MRN: [id number] Date of Birth: 19-Feb-2001

## 2019-12-04 ENCOUNTER — Encounter: Payer: 59 | Admitting: Rehabilitative and Restorative Service Providers"

## 2019-12-06 ENCOUNTER — Encounter: Payer: 59 | Admitting: Physical Therapy

## 2019-12-11 ENCOUNTER — Other Ambulatory Visit: Payer: Self-pay

## 2019-12-11 ENCOUNTER — Ambulatory Visit (INDEPENDENT_AMBULATORY_CARE_PROVIDER_SITE_OTHER): Payer: 59 | Admitting: Physical Therapy

## 2019-12-11 ENCOUNTER — Encounter: Payer: Self-pay | Admitting: Physical Therapy

## 2019-12-11 DIAGNOSIS — M6281 Muscle weakness (generalized): Secondary | ICD-10-CM

## 2019-12-11 DIAGNOSIS — R2689 Other abnormalities of gait and mobility: Secondary | ICD-10-CM

## 2019-12-11 DIAGNOSIS — M25661 Stiffness of right knee, not elsewhere classified: Secondary | ICD-10-CM

## 2019-12-11 NOTE — Patient Instructions (Signed)
Access Code: [email or web address]: [email or web address]: 03/23/2021Prepared by: [facility] [place] Knee flexion no greater than 90 deg, no twisting, no jumping.  Exercises  Step Up - 1 x daily - 7 x weekly - 10 reps - 3 sets  The Diver - 1 x daily - 4 x weekly - 2 sets - 10 reps  Single Leg Press - 1 x daily - 4 x weekly - 3 sets - 10 reps  Single Leg Knee Extension with Weight Machine - 1 x daily - 4 x weekly - 3 sets - 10 reps  Lateral Lunge with Slider - 1 x daily - 4 x weekly - 2 sets - 10 reps  Standing Single Leg Heel Raise - 1 x daily - 4 x weekly - 3 sets - 10 reps  Continue stretches for calves, hamstrings, quads, ITB - 30 sec, 3 reps, 1-2 x/ day  Also - askling protocol sliders for Lt hamstring.

## 2019-12-11 NOTE — Therapy (Addendum)
[place], [place], [address] Phone: [phone]   Fax:  [phone]  Physical Therapy Treatment and Discharge Summary  Patient Details  Name: Ashley Fischer MRN: [id number] Date of Birth: Aug 26, 2001 Referring Provider (PT): Erie Noe MD   Encounter Date: 12/11/2019  PT End of Session - 12/11/19 0848    Visit Number  25    Number of Visits  32    Date for PT Re-Evaluation  12/18/19    PT Start Time  0803    PT Stop Time  6962    PT Time Calculation (min)  44 min    Activity Tolerance  Patient tolerated treatment well;No increased pain    Behavior During Therapy  [facility] for tasks assessed/performed       History reviewed. No pertinent past medical history.  Past Surgical History:  Procedure Laterality Date  . ARTHROSCOPIC REPAIR ACL      There were no vitals filed for this visit.  Subjective Assessment - 12/11/19 0958    Subjective  Pt reports she has been doing LE strengthening with trainer at school. She reports some knee pain with lateral step downs from high box.  Split squats are getting easier.    Patient Stated Goals  to get back to playing basketball    Currently in Pain?  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 (X52.841L)    Referring Provider (PT)  Erie Noe MD    Onset Date/Surgical Date  08/03/19    Hand Dominance  Right    Next MD Visit  01/21/2020    Prior Therapy  yes      Strength   Right Knee Flexion  --   5-/5   Right Knee Extension  4+/5   5-/5   Left Knee Flexion  4-/5    Left Knee Extension  5/5        [facility] Adult PT Treatment/Exercise - 12/11/19 0001      Knee/Hip Exercises: Stretches   Passive Hamstring Stretch  Both;1 rep;30 seconds    Quad Stretch  Right;Left;20 seconds;2 reps   standing   ITB Stretch   Right;Left;1 rep;20 seconds      Knee/Hip Exercises: Aerobic   Elliptical  L4: 3 min backward, 2 min forward       Knee/Hip Exercises: Machines for Strengthening   Cybex Knee Extension  RLE x 10 reps with 3 plates     Total Gym Leg Press  RLE x 10 reps x 6 plates.       Knee/Hip Exercises: Standing   Lateral Step Up  Right;1 set;5 reps   10" step (80 deg knee flexion upon descent)   Walking with Sports Cord  Orange XTS, side stepping with legs in mini squat x 10 reps each side (challenging)    Other Standing Knee Exercises  Rt/Lt split squat x 5 each leg (keeping depth to 90 deg per protocol), then 10 reps with 10 pulses    Other Standing Knee Exercises  single leg dead lift to touch floor x 10 each leg;  Rt single leg squat with Lt leg sliding into abdct x 10, askling glides x 5 reps RLE, 10 reps LLE.     Knee/Hip Exercises: Supine  Straight Leg Raises Limitations  long sitting SLR with ER RLE x 5 reps              PT Education - 12/11/19 0956    Education Details  HEP    Person(s) Educated  Patient    Methods  Explanation;Handout;Demonstration;Verbal cues    Comprehension  Verbalized understanding;Returned demonstration       PT Short Term Goals - 09/28/19 1435      PT SHORT TERM GOAL #1   Title  Initial rehab program addressing ROM and active movement per protocol    Time  4    Period  Weeks    Status  Achieved    Target Date  09/03/19      PT SHORT TERM GOAL #2   Title  ROM Rt knee 0 degrees extension to 90 degrees flexion    Time  4    Period  Weeks    Status  Achieved    Target Date  09/03/19      PT SHORT TERM GOAL #3   Title  Patient able to tolerate 50% WB on RLE with gait    Baseline  -----    Time  6    Period  Weeks    Status  Achieved    Target Date  09/17/19      PT SHORT TERM GOAL #4   Title  decreased inflammation in Rt knee to within 1 cm of left knee    Time  6    Period  Weeks    Status  Achieved    Target Date  09/17/19      PT  SHORT TERM GOAL #5   Title  -----------        PT Long Term Goals - 11/06/19 1308      PT LONG TERM GOAL #1   Title  I with advanced HEP    Time  6    Period  Weeks    Status  Revised    Target Date  12/18/19      PT LONG TERM GOAL #2   Title  increase Rt LE strength to 5/5    Time  6    Period  Weeks    Status  Revised    Target Date  12/18/19      PT LONG TERM GOAL #3   Title  The patient will tolerate jogging x 2 minutes to begin return to sport specific training.  *AS PROTOCOL PROGRESSION ALLOWS    Time  6    Period  Weeks    Status  New    Target Date  12/18/19      PT LONG TERM GOAL #4   Title  The patient will tolerate plyometric/ agiity ladder training to begin return to sport specific training.  *AS PROTOCOL PROGRESSION ALLOWS    Time  6    Period  Weeks    Status  New    Target Date  12/18/19      PT LONG TERM GOAL #5   Title  The patient will perform single leg hopping x 10 reps R LE.  *AS PROTOCOL PROGRESSION ALLOWS    Time  6    Period  Weeks    Status  New    Target Date  12/18/19            Plan - 12/11/19 0808    Clinical Impression Statement  Pt continues with some functional weakness in Rt  quad and weakness in Lt hamstring.  Pt tolerated all exercises well, without any production of symptoms.  Advanced HEP and encouraged pt to speak to trainer regarding restrictions to avoid reinjury of knee.  Spoke to supervising PT; will hold therapy until able to advance to next phase in rehab.    Rehab Potential  Excellent    PT Frequency  2x / week    PT Duration  12 weeks    PT Treatment/Interventions  ADLs/Self Care Home Management;Cryotherapy;Occupational psychologist;Therapeutic exercise;Balance training;Neuromuscular re-education;Patient/family education;Manual techniques;Scar mobilization;Passive range of motion;Taping;Vasopneumatic Device    PT Next Visit Plan  will hold therapy until next phase in rehab; pt to return  after MD visit 5/3.    PT Home Exercise Plan  [email or web address]    Consulted and Agree with Plan of Care  Patient       Patient will benefit from skilled therapeutic intervention in order to improve the following deficits and impairments:  Impaired sensation, Decreased range of motion, Decreased strength, Decreased activity tolerance, Increased edema, Pain, Difficulty walking, Hypomobility  Visit Diagnosis: Muscle weakness (generalized)  Stiffness of right knee, not elsewhere classified  Other abnormalities of gait and mobility     Problem List Patient Active Problem List   Diagnosis Date Noted  . Left anterior cruciate ligament tear 09/08/2015  . ALLERGIC RHINITIS, SEASONAL 01/01/2011  . EPISTAXIS, RECURRENT 01/01/2011    PHYSICAL THERAPY DISCHARGE SUMMARY  Visits from Start of Care: 23  Current functional level related to goals / functional outcomes: See goals above.   Patient limited in progression due to protocol and time required for healing before moving to next phase of therapy.    Remaining deficits: See above for patient status at time of d/c.  Patient did not return.  Per notes, she is progressing with ATC and strength and conditioning specialist.   Education / Equipment: Home program.  Plan: Patient agrees to discharge.  Patient goals were partially met. Patient is being discharged due to not returning since the last visit.  ?????        Thank you for the referral of this patient. Rudell Cobb, MPT  Kerin Perna, [place] 12/11/19 10:01 AM  [facility] [place], [place], [address] Phone: [phone]   Fax:  [phone]  Name: Ashley Fischer MRN: [id number] Date of Birth: [birth year]
